# Patient Record
Sex: Male | Born: 1990 | Race: White | Hispanic: No | Marital: Married | Smoking: Never smoker
Health system: Southern US, Community
[De-identification: ages and names within clinical notes are randomized; demographics above are authoritative.]

---
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
## 2013-10-24 ENCOUNTER — Emergency Department: Payer: Self-pay | Admitting: Internal Medicine

## 2015-09-06 ENCOUNTER — Emergency Department
Admission: EM | Admit: 2015-09-06 | Discharge: 2015-09-06 | Disposition: A | Discharge status: Home / Self Care | Payer: 59 | Attending: Emergency Medicine | Admitting: Emergency Medicine

## 2015-09-06 ENCOUNTER — Emergency Department: Payer: 59

## 2015-09-06 ENCOUNTER — Encounter: Payer: Self-pay | Admitting: Emergency Medicine

## 2015-09-06 DIAGNOSIS — M79672 Pain in left foot: Secondary | ICD-10-CM | POA: Diagnosis not present

## 2015-09-06 DIAGNOSIS — M25572 Pain in left ankle and joints of left foot: Secondary | ICD-10-CM | POA: Diagnosis present

## 2015-09-06 NOTE — Discharge Instructions (Signed)
Musculoskeletal Pain Musculoskeletal pain is muscle and boney aches and pains. These pains can occur in any part of the body. Your caregiver may treat you without knowing the cause of the pain. They may treat you if blood or urine tests, X-rays, and other tests were normal.  CAUSES There is often not a definite cause or reason for these pains. These pains may be caused by a type of germ (virus). The discomfort may also come from overuse. Overuse includes working out too hard when your body is not fit. Boney aches also come from weather changes. Bone is sensitive to atmospheric pressure changes. HOME CARE INSTRUCTIONS   Ask when your test results will be ready. Make sure you get your test results.  Only take over-the-counter or prescription medicines for pain, discomfort, or fever as directed by your caregiver. If you were given medications for your condition, do not drive, operate machinery or power tools, or sign legal documents for 24 hours. Do not drink alcohol. Do not take sleeping pills or other medications that may interfere with treatment.  Continue all activities unless the activities cause more pain. When the pain lessens, slowly resume normal activities. Gradually increase the intensity and duration of the activities or exercise.  During periods of severe pain, bed rest may be helpful. Lay or sit in any position that is comfortable.  Putting ice on the injured area.  Put ice in a bag.  Place a towel between your skin and the bag.  Leave the ice on for 15 to 20 minutes, 3 to 4 times a day.  Follow up with your caregiver for continued problems and no reason can be found for the pain. If the pain becomes worse or does not go away, it may be necessary to repeat tests or do additional testing. Your caregiver may need to look further for a possible cause. SEEK IMMEDIATE MEDICAL CARE IF:  You have pain that is getting worse and is not relieved by medications.  You develop chest pain  that is associated with shortness or breath, sweating, feeling sick to your stomach (nauseous), or throw up (vomit).  Your pain becomes localized to the abdomen.  You develop any new symptoms that seem different or that concern you. MAKE SURE YOU:   Understand these instructions.  Will watch your condition.  Will get help right away if you are not doing well or get worse.   This information is not intended to replace advice given to you by your health care provider. Make sure you discuss any questions you have with your health care provider.   Document Released: 04/29/2005 Document Revised: 07/22/2011 Document Reviewed: 01/01/2013 Elsevier Interactive Patient Education 2016 [employer]   You can try Motrin for the over-the-counter pills 3 times a day with food for maybe a week. Please follow-up with Dr. Ether GriffinsFowler the podiatrist he may be over to help you more.

## 2015-09-06 NOTE — ED Notes (Signed)
Patient ambulatory to triage with steady gait, without difficulty or distress noted; pt reports left ankle pain x year; st dx tendonitis

## 2015-09-06 NOTE — ED Notes (Signed)
Pt presents to ED with c/o left ankle and foot pain x 1.5 years, pt denies known injury to extremity. Pt reports has been seen for same symptoms and dx with tendinitis. Pt reports pain has been increasing for the past two days. Reports pain increased with ambulation, swelling noted to top of left foot. (+) pedal pulses, capillary refill WNL, (+) movement and sensation. Pt alert and oriented x 4, no increased work in breathing noted.

## 2015-09-06 NOTE — ED Provider Notes (Signed)
[facility] Emergency Department Provider Note  ____________________________________________  Time seen: Approximately 7:27 AM  I have reviewed the triage vital signs and the nursing notes.   HISTORY  Chief Complaint Ankle Pain    HPI Tony Hubbard is a 25 y.o. male who reports for the last year and a half he's had some pain and swelling in the foot especially the top of the foot just distal to the ankle. This gets worse at the end of the day when he takes his shoe off. Usually he can walk around all day without trouble. It evaluated at urgent care one time sometime ago and they told him it was tendinitis but has never gotten any better. It is moderate in nature achy he has had no redness fever streaks up the leg or any other kind of associated symptoms   History reviewed. No pertinent past medical history.  There are no active problems to display for this patient.   History reviewed. No pertinent past surgical history.  No current outpatient prescriptions on file.  Allergies Review of patient's allergies indicates no known allergies.  No family history on file.  Social History Social History  Substance Use Topics  . Smoking status: Never Smoker   . Smokeless tobacco: None  . Alcohol Use: No    Review of Systems Constitutional: No fever/chills Eyes: No visual changes. ENT: No sore throat. Cardiovascular: Denies chest pain. Respiratory: Denies shortness of breath. Gastrointestinal: No abdominal pain.  No nausea, no vomiting.  No diarrhea.  No constipation. Genitourinary: Negative for dysuria. Musculoskeletal: Negative for back pain. Skin: Negative for rash.  10-point ROS otherwise negative.  ____________________________________________   PHYSICAL EXAM:  VITAL SIGNS: ED Triage Vitals  Enc Vitals Group     BP 09/06/15 0630 137/87 mmHg     Pulse Rate 09/06/15 0630 84     Resp 09/06/15 0630 18     Temp 09/06/15 0630 97.6 F (36.4 C)      Temp Source 09/06/15 0630 Oral     SpO2 09/06/15 0630 99 %     Weight 09/06/15 0630 300 lb (136.079 kg)     Height 09/06/15 0630  (1.956 m)     Head Cir --      Peak Flow --      Pain Score 09/06/15 0630 5     Pain Loc --      Pain Edu? --      Excl. in [place]? --    Constitutional: Alert and oriented. Well appearing and in no acute distress. Eyes: Conjunctivae are normal. PERRL. EOMI. Head: Atraumatic. Nose: No congestion/rhinnorhea. Mouth/Throat: Mucous membranes are moist.  Oropharynx non-erythematous. Neck: No stridor. Musculoskeletal: No lower extremity tenderness nor edema.  No joint effusions. Neurologic:  Normal speech and language. No gross focal neurologic deficits are appreciated. No gait instability. Skin:  Skin is warm, dry and intact. No rash noted. Psychiatric: Mood and affect are normal. Speech and behavior are normal.  ____________________________________________   LABS (all labs ordered are listed, but only abnormal results are displayed)  Labs Reviewed - No data to display ____________________________________________  EKG   ____________________________________________  RADIOLOGY  X-rays of foot and ankle reviewed by radiology showing no acute disease no explanation for patient's pain ____________________________________________   PROCEDURES   ____________________________________________   INITIAL IMPRESSION / ASSESSMENT AND PLAN / ED COURSE  Pertinent labs & imaging results that were available during my care of the patient were reviewed by me and considered  in my medical decision making (see chart for details).   ____________________________________________   FINAL CLINICAL IMPRESSION(S) / ED DIAGNOSES  Final diagnoses:  Foot pain, left      Arnaldo NatalPaul F Malinda, MD 09/06/15 [phone]

## 2015-09-28 ENCOUNTER — Other Ambulatory Visit: Payer: Self-pay | Admitting: Podiatry

## 2015-09-28 DIAGNOSIS — M67479 Ganglion, unspecified ankle and foot: Secondary | ICD-10-CM

## 2015-10-20 ENCOUNTER — Ambulatory Visit
Admission: RE | Admit: 2015-10-20 | Discharge: 2015-10-20 | Disposition: A | Discharge status: Home / Self Care | Payer: 59 | Source: Ambulatory Visit | Attending: Podiatry | Admitting: Podiatry

## 2015-10-20 DIAGNOSIS — M19072 Primary osteoarthritis, left ankle and foot: Secondary | ICD-10-CM | POA: Diagnosis not present

## 2015-10-20 DIAGNOSIS — M659 Synovitis and tenosynovitis, unspecified: Secondary | ICD-10-CM | POA: Insufficient documentation

## 2015-10-20 DIAGNOSIS — M67479 Ganglion, unspecified ankle and foot: Secondary | ICD-10-CM

## 2015-10-20 DIAGNOSIS — M25572 Pain in left ankle and joints of left foot: Secondary | ICD-10-CM | POA: Diagnosis present

## 2015-10-20 MED ORDER — GADOBENATE DIMEGLUMINE 529 MG/ML IV SOLN
20.0000 mL | Freq: Once | INTRAVENOUS | Status: AC | PRN
  Administered 2015-10-20: 20 mL via INTRAVENOUS

## 2016-09-30 ENCOUNTER — Emergency Department
Admission: EM | Admit: 2016-09-30 | Discharge: 2016-09-30 | Disposition: A | Discharge status: Home / Self Care | Payer: No Typology Code available for payment source | Attending: Emergency Medicine | Admitting: Emergency Medicine

## 2016-09-30 DIAGNOSIS — M545 Low back pain, unspecified: Secondary | ICD-10-CM

## 2016-09-30 DIAGNOSIS — Y939 Activity, unspecified: Secondary | ICD-10-CM | POA: Insufficient documentation

## 2016-09-30 DIAGNOSIS — Y999 Unspecified external cause status: Secondary | ICD-10-CM | POA: Insufficient documentation

## 2016-09-30 DIAGNOSIS — S39012A Strain of muscle, fascia and tendon of lower back, initial encounter: Secondary | ICD-10-CM | POA: Insufficient documentation

## 2016-09-30 DIAGNOSIS — Y929 Unspecified place or not applicable: Secondary | ICD-10-CM | POA: Insufficient documentation

## 2016-09-30 DIAGNOSIS — X58XXXA Exposure to other specified factors, initial encounter: Secondary | ICD-10-CM | POA: Insufficient documentation

## 2016-09-30 MED ORDER — CYCLOBENZAPRINE HCL 5 MG PO TABS: 5.0000 mg | ORAL_TABLET | Freq: Three times a day (TID) | ORAL | 0 refills | Status: DC | PRN

## 2016-09-30 MED ORDER — NABUMETONE 750 MG PO TABS: 750.0000 mg | ORAL_TABLET | Freq: Two times a day (BID) | ORAL | 0 refills | Status: DC

## 2016-09-30 NOTE — Discharge Instructions (Signed)
Your exam is essentially normal. Take the prescription meds as directed. Follow-up with [facility] for continued symptoms.

## 2016-09-30 NOTE — ED Notes (Signed)
See triage notes..the patient c/o lower back pain. Denies injury. Pt denies n/v/d, SOB, CP, urinary s/s or penile discharge. NAD.

## 2016-09-30 NOTE — ED Triage Notes (Signed)
Pt presents with low back pain without known injury that radiates to hips

## 2016-09-30 NOTE — ED Provider Notes (Signed)
[facility] Emergency Department Provider Note ____________________________________________  Time seen: 1916  I have reviewed the triage vital signs and the nursing notes.  HISTORY  Chief Complaint  Back Pain  HPI Tony Hubbard is a 26 y.o. male Presents to the ED for evaluation of low back pain without known injury. Patient reports a 4-5 day complaint of midline lower back pain with referral source the upper buttocks. He denies any bladder or bowel incontinence, foot drop, or distal paresthesias. 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 He's been seen in the past for similar flares but reports symptoms use resolved after seen in urgent care provider and being put on anti-inflammatories and muscle relaxants. He denies any treatment at home since the onset of his symptoms other than topical muscle rubs.  No past medical history on file.  There are no active problems to display for this patient.   No past surgical history on file.  Prior to Admission medications   Medication Sig Start Date End Date Taking? Authorizing Provider  cyclobenzaprine (FLEXERIL) 5 MG tablet Take 1 tablet (5 mg total) by mouth 3 (three) times daily as needed for muscle spasms. 09/30/16   Genevia Bouldin, Charlesetta Ivory, PA-C  nabumetone (RELAFEN) 750 MG tablet Take 1 tablet (750 mg total) by mouth 2 (two) times daily. 09/30/16   Jaime Grizzell, Charlesetta Ivory, PA-C   Allergies Patient has no known allergies.  No family history on file.  Social History Social History  Substance Use Topics  . Smoking status: Never Smoker  . Smokeless tobacco: Not on file  . Alcohol use No    Review of Systems  Constitutional: Negative for fever. Cardiovascular: Negative for chest pain. Respiratory: Negative for shortness of breath. Gastrointestinal: Negative for abdominal pain, vomiting and diarrhea. Genitourinary: Negative for  dysuria. Musculoskeletal: Positive for back pain. Skin: Negative for rash. Neurological: Negative for headaches, focal weakness or numbness. ____________________________________________  PHYSICAL EXAM:  VITAL SIGNS: ED Triage Vitals [09/30/16 1749]  Enc Vitals Group     BP 109/75     Pulse Rate 60     Resp 18     Temp      Temp src      SpO2 99 %     Weight (!) 305 lb (138.3 kg)     Height 6\' 5"  (1.956 m)     Head Circumference      Peak Flow      Pain Score 7     Pain Loc      Pain Edu?      Excl. in GC?     Constitutional: Alert and oriented. Well appearing and in no distress. Head: Normocephalic and atraumatic. Cardiovascular: Normal rate, regular rhythm. Normal distal pulses. Respiratory: Normal respiratory effort. No wheezes/rales/rhonchi. Gastrointestinal: Soft and nontender. No distention. Musculoskeletal:Normal spinal alignment without midline tenderness, spasm, deformity, or step-off. Normal supine to sit transition. Negative seated straight leg raise. Normal hip flexion as well as internal and intact rotation. Nontender with normal range of motion in all extremities.  Neurologic: Cranial nerves II through XII grossly intact. Normal LE DTRs bilaterally. Normal toe dorsiflexion and eversion noted. Normal gait without ataxia. Normal speech and language. No gross focal neurologic deficits are appreciated. Skin:  Skin is warm, dry and intact. No rash noted. ____________________________________________  INITIAL IMPRESSION / ASSESSMENT AND PLAN / ED COURSE  Patient with a current presentation of acute lumbar sacral  strain without sciatica. He is discharged at this time with prescription for Relafen and Flexeril dose as directed. He is further given instructions to follow with [place] for ongoing symptom management. Work note was provided for 1-2 days as needed. ____________________________________________  FINAL CLINICAL IMPRESSION(S) / ED DIAGNOSES  Final  diagnoses:  Strain of lumbar region, initial encounter  Acute bilateral low back pain without sciatica      Lissa HoardMenshew, Kailan Laws V Bacon, PA-C 09/30/16 1944    Minna AntisPaduchowski, Kevin, MD 09/30/16 2235

## 2018-08-14 DIAGNOSIS — Z7689 Persons encountering health services in other specified circumstances: Secondary | ICD-10-CM | POA: Diagnosis not present

## 2018-08-14 DIAGNOSIS — R110 Nausea: Secondary | ICD-10-CM | POA: Diagnosis not present

## 2018-08-14 DIAGNOSIS — R197 Diarrhea, unspecified: Secondary | ICD-10-CM | POA: Diagnosis not present

## 2018-09-29 DIAGNOSIS — H6982 Other specified disorders of Eustachian tube, left ear: Secondary | ICD-10-CM | POA: Diagnosis not present

## 2018-09-29 DIAGNOSIS — H9192 Unspecified hearing loss, left ear: Secondary | ICD-10-CM | POA: Diagnosis not present

## 2018-11-17 ENCOUNTER — Emergency Department

## 2018-11-17 ENCOUNTER — Emergency Department
Admission: EM | Admit: 2018-11-17 | Discharge: 2018-11-17 | Disposition: A | Discharge status: Home / Self Care | Attending: Emergency Medicine | Admitting: Emergency Medicine

## 2018-11-17 ENCOUNTER — Encounter: Payer: Self-pay | Admitting: Emergency Medicine

## 2018-11-17 ENCOUNTER — Other Ambulatory Visit: Payer: Self-pay

## 2018-11-17 DIAGNOSIS — Y9289 Other specified places as the place of occurrence of the external cause: Secondary | ICD-10-CM | POA: Insufficient documentation

## 2018-11-17 DIAGNOSIS — Y990 Civilian activity done for income or pay: Secondary | ICD-10-CM | POA: Insufficient documentation

## 2018-11-17 DIAGNOSIS — X509XXA Other and unspecified overexertion or strenuous movements or postures, initial encounter: Secondary | ICD-10-CM | POA: Diagnosis not present

## 2018-11-17 DIAGNOSIS — S63631A Sprain of interphalangeal joint of left index finger, initial encounter: Secondary | ICD-10-CM | POA: Diagnosis not present

## 2018-11-17 DIAGNOSIS — Y9389 Activity, other specified: Secondary | ICD-10-CM | POA: Insufficient documentation

## 2018-11-17 DIAGNOSIS — S6992XA Unspecified injury of left wrist, hand and finger(s), initial encounter: Secondary | ICD-10-CM | POA: Diagnosis present

## 2018-11-17 NOTE — ED Provider Notes (Signed)
[facility] Emergency Department Provider Note ____________________________________________  Time seen: 2111  I have reviewed the triage vital signs and the nursing notes.  HISTORY  Chief Complaint  Finger Injury  HPI Tony Hubbard is a 28 y.o. male presents himself to the ED for evaluation of left index finger pain.  Patient is right-hand dominant, describes an injury that occurred about a week prior at work.  He describes his left index finger got caught in packing tape that he was cutting away from large packing boxes.  The boxes failed as the tape was pulled away, and caused a twisting injury as the tape wrapped around his index finger.  Since that time he said pain and disability across the dorsal finger primarily at the PIP.  He describes pain with attempts to fully extend the finger, but does not describe any decreased range of motion.  He denies any other injury at this time.  History reviewed. No pertinent past medical history.  There are no active problems to display for this patient.  History reviewed. No pertinent surgical history.  Prior to Admission medications   Not on File    Allergies Patient has no known allergies.  No family history on file.  Social History Social History   Tobacco Use  . Smoking status: Never Smoker  Substance Use Topics  . Alcohol use: No  . Drug use: Not on file    Review of Systems  Constitutional: Negative for fever. Cardiovascular: Negative for chest pain. Respiratory: Negative for shortness of breath. Musculoskeletal: Negative for back pain.  Left index finger pain as above. Skin: Negative for rash. Neurological: Negative for headaches, focal weakness or numbness. ____________________________________________  PHYSICAL EXAM:  VITAL SIGNS: ED Triage Vitals  Enc Vitals Group     BP 11/17/18 1914 125/79     Pulse Rate 11/17/18 1914 84     Resp --      Temp 11/17/18 1914 98.2 F (36.8 C)     Temp  Source 11/17/18 1914 Oral     SpO2 11/17/18 1914 97 %     Weight 11/17/18 1915 (!) 318 lb (144.2 kg)     Height 11/17/18 1915 6\' 5"  (1.956 m)     Head Circumference --      Peak Flow --      Pain Score 11/17/18 1914 8     Pain Loc --      Pain Edu? --      Excl. in [place]? --     Constitutional: Alert and oriented. Well appearing and in no distress. Head: Normocephalic and atraumatic. Eyes: Conjunctivae are normal. Normal extraocular movements Cardiovascular: Normal rate, regular rhythm. Normal distal pulses. Respiratory: Normal respiratory effort. Musculoskeletal: Left hand without any obvious deformity or dislocation.  There is some subtle swelling over the left index finger at the PIP.  Patient with full extension range and slightly decreased flexion range secondary to pain at the PIP.  Normal composite fist noted. Nontender with normal range of motion in all extremities.  Neurologic:  Normal gross sensation. Normal speech and language. No gross focal neurologic deficits are appreciated. Skin:  Skin is warm, dry and intact. No rash noted. ____________________________________________   RADIOLOGY  Left Index Finger  Negative  I, Jung Yurchak V Bacon-Nathaneil Feagans, personally viewed and evaluated these images (plain radiographs) as part of my medical decision making, as well as reviewing the written report by the radiologist. ____________________________________________  PROCEDURES  Procedures Buddy tape Finger splint ____________________________________________  INITIAL  IMPRESSION / ASSESSMENT AND PLAN / ED COURSE  Ronne BinningJohn L Abraha was evaluated in Emergency Department on 11/17/2018 for the symptoms described in the history of present illness. He was evaluated in the context of the global COVID-19 pandemic, which necessitated consideration that the patient might be at risk for infection with the SARS-CoV-2 virus that causes COVID-19. Institutional protocols and algorithms that pertain to the  evaluation of patients at risk for COVID-19 are in a state of rapid change based on information released by regulatory bodies including the CDC and federal and state organizations. These policies and algorithms were followed during the patient's care in the ED.  Patient with ED evaluation of left index pain and disability. He is found to have a IP strain after his negative XR. Fingers are buddy-taped and a static splint is provided. He will follow-up with Ortho or the company's preferred provider.  ____________________________________________  FINAL CLINICAL IMPRESSION(S) / ED DIAGNOSES  Final diagnoses:  Sprain of interphalangeal joint of left index finger, initial encounter      Lissa HoardMenshew, Gaye Scorza V Bacon, PA-C 11/17/18 2203    Arnaldo NatalMalinda, Paul F, MD 11/17/18 2255

## 2018-11-17 NOTE — Discharge Instructions (Addendum)
Your exam and XR are consistent with a finger sprain. There is no evidence of a fracture or dislocation. Wear the finger splint or buddy tape the finger for comfort. Follow-up with Ortho if symptoms don't improve in 1-2 weeks.

## 2018-11-17 NOTE — ED Notes (Signed)
Pt to the er for injury sustained last week. Pt was at work pulling plastic off a box when the box fell on his hand. Pt states that the pain wasn't that bad but the next day it had become swollen and had decreased ROM. Pt is able to bend the index finger on the left hand to a 45 degree angle but unable to close his hand all the way. Mild bruising noted to the posterior finger at the middle knuckle. Mild swelling present.

## 2018-11-17 NOTE — ED Triage Notes (Signed)
Pt arrives with concerns over pain to his left index finger from mechanical injury last Thursday.

## 2018-12-16 ENCOUNTER — Other Ambulatory Visit: Payer: Self-pay

## 2018-12-16 ENCOUNTER — Ambulatory Visit
Admission: EM | Admit: 2018-12-16 | Discharge: 2018-12-16 | Disposition: A | Discharge status: Home / Self Care | Payer: 59 | Attending: Family Medicine | Admitting: Family Medicine

## 2018-12-16 DIAGNOSIS — M5442 Lumbago with sciatica, left side: Secondary | ICD-10-CM | POA: Diagnosis not present

## 2018-12-16 MED ORDER — METAXALONE 800 MG PO TABS: 800.0000 mg | ORAL_TABLET | Freq: Three times a day (TID) | ORAL | 0 refills | Status: DC | PRN

## 2018-12-16 MED ORDER — MELOXICAM 15 MG PO TABS: 15.0000 mg | ORAL_TABLET | Freq: Every day | ORAL | 0 refills | Status: DC | PRN

## 2018-12-16 NOTE — ED Triage Notes (Signed)
Pt here for sciatica that started last night. He states he has had this before a while ago but having pain mostly on his left low back and shoots to the right side. Pain is constant but worse with movement and no urinary symptoms or injuries reported.

## 2018-12-16 NOTE — ED Provider Notes (Signed)
[facility]    CSN: [id number] Arrival date & time: 12/16/18  1637  History   Chief Complaint Chief Complaint  Patient presents with  . Sciatica   HPI  28 year old male presents with back pain.  Patient reports left low back pain which started last night. Severe. Worse with movement. Has taking Advil or Aleve (unsure which) without resolution. Reports a history of sciatica. Reports numbness/tingling down the left leg. No urinary symptoms. No other associated symptoms. No other complaints.  PMH, Surgical Hx, Family Hx, Social History reviewed and updated as below.  PMH: Hx of fracture; Hx of back pain; obesity  Surgical Hx: Wisdom teeth extraction  Home Medications    Prior to Admission medications   Medication Sig Start Date End Date Taking? Authorizing Provider  meloxicam (MOBIC) 15 MG tablet Take 1 tablet (15 mg total) by mouth daily as needed. 12/16/18   Coral Spikes, DO  metaxalone (SKELAXIN) 800 MG tablet Take 1 tablet (800 mg total) by mouth 3 (three) times daily as needed for muscle spasms. 12/16/18   Coral Spikes, DO    Family History Family History  Problem Relation Age of Onset  . Healthy Mother   . Healthy Father     Social History Social History   Tobacco Use  . Smoking status: Never Smoker  . Smokeless tobacco: Never Used  Substance Use Topics  . Alcohol use: No  . Drug use: Not on file     Allergies   Patient has no known allergies.   Review of Systems Review of Systems  Constitutional: Negative.   Genitourinary: Negative.   Musculoskeletal: Positive for back pain.   Physical Exam Triage Vital Signs ED Triage Vitals  Enc Vitals Group     BP 12/16/18 1733 121/76     Pulse Rate 12/16/18 1733 71     Resp 12/16/18 1733 18     Temp 12/16/18 1733 98.1 F (36.7 C)     Temp Source 12/16/18 1733 Oral     SpO2 12/16/18 1733 99 %     Weight 12/16/18 1734 (!) 317 lb (143.8 kg)     Height 12/16/18 1734 6\' 5"  (1.956 m)     Head  Circumference --      Peak Flow --      Pain Score 12/16/18 1734 8     Pain Loc --      Pain Edu? --      Excl. in [place]? --    Updated Vital Signs BP 121/76 (BP Location: Right Arm)   Pulse 71   Temp 98.1 F (36.7 C) (Oral)   Resp 18   Ht 6\' 5"  (1.956 m)   Wt (!) 143.8 kg   SpO2 99%   BMI 37.59 kg/m   Visual Acuity Right Eye Distance:   Left Eye Distance:   Bilateral Distance:    Right Eye Near:   Left Eye Near:    Bilateral Near:     Physical Exam Vitals signs and nursing note reviewed.  Constitutional:      General: He is not in acute distress.    Appearance: Normal appearance. He is obese.  HENT:     Head: Normocephalic and atraumatic.  Eyes:     General:        Right eye: No discharge.        Left eye: No discharge.     Conjunctiva/sclera: Conjunctivae normal.  Cardiovascular:     Rate and Rhythm:  Normal rate and regular rhythm.  Pulmonary:     Effort: Pulmonary effort is normal.     Breath sounds: Normal breath sounds.  Musculoskeletal:     Comments: Lumbar spine - left sided paraspinal musculature tenderness to palpation. Decreased ROM secondary to pain.  Neurological:     Mental Status: He is alert.  Psychiatric:        Mood and Affect: Mood normal.        Behavior: Behavior normal.    UC Treatments / Results  Labs (all labs ordered are listed, but only abnormal results are displayed) Labs Reviewed - No data to display  EKG   Radiology No results found.  Procedures Procedures (including critical care time)  Medications Ordered in UC Medications - No data to display  Initial Impression / Assessment and Plan / UC Course  I have reviewed the triage vital signs and the nursing notes.  Pertinent labs & imaging results that were available during my care of the patient were reviewed by me and considered in my medical decision making (see chart for details).    28 year old male present with low back pain with sciatica. Treating with mobic  and skelaxin.  Final Clinical Impressions(s) / UC Diagnoses   Final diagnoses:  Acute left-sided low back pain with left-sided sciatica   Discharge Instructions   None    ED Prescriptions    Medication Sig Dispense Auth. Provider   meloxicam (MOBIC) 15 MG tablet Take 1 tablet (15 mg total) by mouth daily as needed. 30 tablet Chandrika Sandles G, DO   metaxalone (SKELAXIN) 800 MG tablet Take 1 tablet (800 mg total) by mouth 3 (three) times daily as needed for muscle spasms. 30 tablet Tommie Samsook, Sten Dematteo G, DO     Controlled Substance Prescriptions [place] Controlled Substance Registry consulted? Not Applicable   Tommie SamsCook, Michael Walrath G, DO 12/16/18 2131

## 2019-05-15 ENCOUNTER — Encounter: Payer: Self-pay | Admitting: Emergency Medicine

## 2019-05-15 ENCOUNTER — Other Ambulatory Visit: Payer: Self-pay

## 2019-05-15 ENCOUNTER — Emergency Department
Admission: EM | Admit: 2019-05-15 | Discharge: 2019-05-15 | Disposition: A | Discharge status: Home / Self Care | Payer: 59 | Attending: Emergency Medicine | Admitting: Emergency Medicine

## 2019-05-15 DIAGNOSIS — R509 Fever, unspecified: Secondary | ICD-10-CM | POA: Diagnosis present

## 2019-05-15 DIAGNOSIS — U071 COVID-19: Secondary | ICD-10-CM | POA: Diagnosis not present

## 2019-05-15 MED ORDER — ACETAMINOPHEN 325 MG PO TABS
650.0000 mg | ORAL_TABLET | Freq: Once | ORAL | Status: AC | PRN
  Administered 2019-05-15: 650 mg via ORAL
  Filled 2019-05-15: qty 2

## 2019-05-15 NOTE — ED Triage Notes (Signed)
Pt arrived via POV with reports of cough, fever, body aches, HA that started last night.  Pt reports tmax of 100.  Pt reports taking Mucinex this morning.  Denies any COVID contacts.

## 2019-05-15 NOTE — ED Provider Notes (Signed)
[facility] Emergency Department Provider Note  ____________________________________________  Time seen: Approximately 9:28 PM  I have reviewed the triage vital signs and the nursing notes.   HISTORY  Chief Complaint Fever, Cough, and Generalized Body Aches    HPI Tony Hubbard is a 29 y.o. male that presents to the emergency department for evaluation of fever, headache, body aches, cough since last night.  Highest fever was 100.  Patient took Mucinex this morning.  Patient does not smoke.  He is otherwise healthy.  He is here today with his wife with similar symptoms.  No shortness of breath, chest pain, nausea, vomiting, abdominal pain, diarrhea.   History reviewed. No pertinent past medical history.  There are no problems to display for this patient.   History reviewed. No pertinent surgical history.  Prior to Admission medications   Medication Sig Start Date End Date Taking? Authorizing Provider  meloxicam (MOBIC) 15 MG tablet Take 1 tablet (15 mg total) by mouth daily as needed. 12/16/18   Coral Spikes, DO  metaxalone (SKELAXIN) 800 MG tablet Take 1 tablet (800 mg total) by mouth 3 (three) times daily as needed for muscle spasms. 12/16/18   Coral Spikes, DO    Allergies Patient has no known allergies.  Family History  Problem Relation Age of Onset  . Healthy Mother   . Healthy Father     Social History Social History   Tobacco Use  . Smoking status: Never Smoker  . Smokeless tobacco: Never Used  Substance Use Topics  . Alcohol use: No  . Drug use: Not on file     Review of Systems  Constitutional: Positive for fever/chills Eyes: No visual changes. No discharge. ENT: Negative for congestion and rhinorrhea. Cardiovascular: No chest pain. Respiratory: Positive for cough. No SOB. Gastrointestinal: No abdominal pain.  No nausea, no vomiting.  No diarrhea.  No constipation. Musculoskeletal: Negative for musculoskeletal pain. Skin:  Negative for rash, abrasions, lacerations, ecchymosis. Neurological: Positive for headaches.   ____________________________________________   PHYSICAL EXAM:  VITAL SIGNS: ED Triage Vitals  Enc Vitals Group     BP 05/15/19 1911 120/73     Pulse Rate 05/15/19 1911 (!) 113     Resp 05/15/19 1911 18     Temp 05/15/19 1911 (!) 100.9 F (38.3 C)     Temp Source 05/15/19 1911 Oral     SpO2 05/15/19 1911 98 %     Weight 05/15/19 1908 (!) 319 lb (144.7 kg)     Height 05/15/19 1908 6\' 5"  (1.956 m)     Head Circumference --      Peak Flow --      Pain Score 05/15/19 1908 5     Pain Loc --      Pain Edu? --      Excl. in [place]? --      Constitutional: Alert and oriented. Well appearing and in no acute distress. Eyes: Conjunctivae are normal. PERRL. EOMI. No discharge. Head: Atraumatic. ENT: No frontal and maxillary sinus tenderness.      Ears: Tympanic membranes pearly gray with good landmarks. No discharge.      Nose: No congestion/rhinnorhea.      Mouth/Throat: Mucous membranes are moist. Oropharynx non-erythematous. Tonsils not enlarged. No exudates. Uvula midline. Neck: No stridor.   Hematological/Lymphatic/Immunilogical: No cervical lymphadenopathy. Cardiovascular: Normal rate, regular rhythm.  Good peripheral circulation. Respiratory: Normal respiratory effort without tachypnea or retractions. Lungs CTAB. Good air entry to the bases with no decreased  or absent breath sounds. Gastrointestinal: Bowel sounds 4 quadrants. Soft and nontender to palpation. No guarding or rigidity. No palpable masses. No distention. Musculoskeletal: Full range of motion to all extremities. No gross deformities appreciated. Neurologic:  Normal speech and language. No gross focal neurologic deficits are appreciated.  Skin:  Skin is warm, dry and intact. No rash noted. Psychiatric: Mood and affect are normal. Speech and behavior are normal. Patient exhibits appropriate insight and  judgement.   ____________________________________________   LABS (all labs ordered are listed, but only abnormal results are displayed)  Labs Reviewed  SARS CORONAVIRUS 2 (TAT 6-24 HRS)   ____________________________________________  EKG   ____________________________________________  RADIOLOGY   No results found.  ____________________________________________    PROCEDURES  Procedure(s) performed:    Procedures    Medications  acetaminophen (TYLENOL) tablet 650 mg (650 mg Oral Given 05/15/19 1915)     ____________________________________________   INITIAL IMPRESSION / ASSESSMENT AND PLAN / ED COURSE  Pertinent labs & imaging results that were available during my care of the patient were reviewed by me and considered in my medical decision making (see chart for details).  Review of the [place] CSRS was performed in accordance of the [facility] prior to dispensing any controlled drugs.   Patient's diagnosis is consistent with Covid 19. Vital signs and exam are reassuring.  Patient is here with his wife who has tested positive for COVID-19 in the emergency department.  Patient's Covid test is pending.  Patient appears well and is staying well hydrated. Patient feels comfortable going home. Patient is to follow up with primary care and health department as needed or otherwise directed. Patient is given ED precautions to return to the ED for any worsening or new symptoms.  Tony Hubbard was evaluated in Emergency Department on 05/15/2019 for the symptoms described in the history of present illness. He was evaluated in the context of the global COVID-19 pandemic, which necessitated consideration that the patient might be at risk for infection with the SARS-CoV-2 virus that causes COVID-19. Institutional protocols and algorithms that pertain to the evaluation of patients at risk for COVID-19 are in a state of rapid change based on information released by regulatory bodies including  the CDC and federal and state organizations. These policies and algorithms were followed during the patient's care in the ED.   ____________________________________________  FINAL CLINICAL IMPRESSION(S) / ED DIAGNOSES  Final diagnoses:  COVID-19      NEW MEDICATIONS STARTED DURING THIS VISIT:  ED Discharge Orders    None          This chart was dictated using voice recognition software/Dragon. Despite best efforts to proofread, errors can occur which can change the meaning. Any change was purely unintentional.    Enid Derry, PA-C 05/15/19 2339    Shaune Pollack, MD 05/17/19 [phone]

## 2019-05-15 NOTE — ED Notes (Signed)
Patient reports fever, chills and body aches since last night.

## 2019-05-16 LAB — SARS CORONAVIRUS 2 (TAT 6-24 HRS): SARS Coronavirus 2: POSITIVE — AB

## 2019-05-17 ENCOUNTER — Telehealth: Payer: Self-pay | Admitting: Emergency Medicine

## 2019-05-17 NOTE — Telephone Encounter (Signed)
Called patient and informed of positive covid .  He is aware.

## 2020-10-02 ENCOUNTER — Ambulatory Visit
Admission: EM | Admit: 2020-10-02 | Discharge: 2020-10-02 | Disposition: A | Discharge status: Home / Self Care | Payer: 59 | Attending: Physician Assistant | Admitting: Physician Assistant

## 2020-10-02 ENCOUNTER — Other Ambulatory Visit: Payer: Self-pay

## 2020-10-02 DIAGNOSIS — Z79899 Other long term (current) drug therapy: Secondary | ICD-10-CM | POA: Insufficient documentation

## 2020-10-02 DIAGNOSIS — Z20822 Contact with and (suspected) exposure to covid-19: Secondary | ICD-10-CM | POA: Insufficient documentation

## 2020-10-02 DIAGNOSIS — M542 Cervicalgia: Secondary | ICD-10-CM | POA: Insufficient documentation

## 2020-10-02 DIAGNOSIS — R42 Dizziness and giddiness: Secondary | ICD-10-CM | POA: Diagnosis not present

## 2020-10-02 DIAGNOSIS — J101 Influenza due to other identified influenza virus with other respiratory manifestations: Secondary | ICD-10-CM | POA: Diagnosis present

## 2020-10-02 DIAGNOSIS — J09X2 Influenza due to identified novel influenza A virus with other respiratory manifestations: Secondary | ICD-10-CM | POA: Diagnosis not present

## 2020-10-02 LAB — RESP PANEL BY RT-PCR (FLU A&B, COVID) ARPGX2
Influenza A by PCR: POSITIVE — AB
Influenza B by PCR: NEGATIVE
SARS Coronavirus 2 by RT PCR: NEGATIVE

## 2020-10-02 MED ORDER — IBUPROFEN 800 MG PO TABS
800.0000 mg | ORAL_TABLET | Freq: Once | ORAL | Status: AC
  Administered 2020-10-02: 800 mg via ORAL

## 2020-10-02 MED ORDER — BENZONATATE 200 MG PO CAPS: 200.0000 mg | ORAL_CAPSULE | Freq: Two times a day (BID) | ORAL | 0 refills | Status: AC | PRN

## 2020-10-02 MED ORDER — OSELTAMIVIR PHOSPHATE 75 MG PO CAPS: 75.0000 mg | ORAL_CAPSULE | Freq: Two times a day (BID) | ORAL | 0 refills | Status: AC

## 2020-10-02 MED ORDER — HYDROCOD POLST-CPM POLST ER 10-8 MG/5ML PO SUER: 5.0000 mL | Freq: Two times a day (BID) | ORAL | 0 refills | Status: DC

## 2020-10-02 NOTE — Discharge Instructions (Signed)
Hydrate well You may continue Motrin or Tylenol for fever and aches.

## 2020-10-02 NOTE — ED Triage Notes (Signed)
Pt c/o headache, dizziness, pain in shoulder blades and neck, bilateral arm numbness/tingling and cough. Pt states his back pain started first, then the cough, arm issues and headache. Pt denies fever, diarrhea - has had some post-tussive emesis.

## 2020-10-02 NOTE — ED Provider Notes (Signed)
[facility]    CSN: [id number] Arrival date & time: 10/02/20  1630      History   Chief Complaint Chief Complaint  Patient presents with  . Cough  . Headache  . Neck Pain    HPI STOCKTON NUNLEY is a 30 y.o. male who presents with R rhomboid pain that started when he stepped wrong and caused him to jerk his back yesterday. Latter that night he was not feeling well and developed a cough. Last night felt as he had a fever but his thermoter registered as normal. Last night in certain arm positions his arms would neck numb and tingle. Has been having non productive cough with cough attacks. This am has HA and body aches and feels worse. Cough attacks makes him dizzy.    History reviewed. No pertinent past medical history.  There are no problems to display for this patient.   History reviewed. No pertinent surgical history.     Home Medications    Prior to Admission medications   Medication Sig Start Date End Date Taking? Authorizing Provider  oseltamivir (TAMIFLU) 75 MG capsule Take 1 capsule (75 mg total) by mouth every 12 (twelve) hours. 10/02/20  Yes Rodriguez-Southworth, Nettie Elm, PA-C  chlorpheniramine-HYDROcodone (TUSSIONEX PENNKINETIC ER) 10-8 MG/5ML SUER Take 5 mLs by mouth 2 (two) times daily. 10/02/20  Yes Rodriguez-Southworth, Nettie Elm, PA-C    Family History Family History  Problem Relation Age of Onset  . Healthy Mother   . Healthy Father     Social History Social History   Tobacco Use  . Smoking status: Never Smoker  . Smokeless tobacco: Never Used  Vaping Use  . Vaping Use: Never used  Substance Use Topics  . Alcohol use: No     Allergies   Patient has no known allergies.   Review of Systems Review of Systems  Constitutional: Positive for chills and fever. Negative for diaphoresis.  HENT: Positive for congestion, postnasal drip and rhinorrhea. Negative for ear discharge, ear pain and sore throat.   Eyes: Negative for discharge.   Respiratory: Positive for cough. Negative for chest tightness, shortness of breath and wheezing.   Cardiovascular: Negative for chest pain.  Gastrointestinal: Negative for diarrhea, nausea and vomiting.  Musculoskeletal: Positive for back pain, myalgias and neck pain. Negative for gait problem and neck stiffness.  Neurological: Positive for dizziness, numbness and headaches. Negative for weakness.     Physical Exam Triage Vital Signs ED Triage Vitals  Enc Vitals Group     BP --      Pulse Rate 10/02/20 1643 (!) 109     Resp 10/02/20 1643 18     Temp 10/02/20 1643 (!) 101.1 F (38.4 C)     Temp Source 10/02/20 1643 Oral     SpO2 10/02/20 1643 99 %     Weight 10/02/20 1641 (!) 327 lb (148.3 kg)     Height 10/02/20 1641 6\' 5"  (1.956 m)     Head Circumference --      Peak Flow --      Pain Score 10/02/20 1641 7     Pain Loc --      Pain Edu? --      Excl. in [place]? --    No data found.  Updated Vital Signs BP 134/80 (BP Location: Left Arm)   Pulse (!) 109   Temp (!) 101.1 F (38.4 C) (Oral)   Resp 18   Ht 6\' 5"  (1.956 m)   Wt (!) 327  lb (148.3 kg)   SpO2 99%   BMI 38.78 kg/m   Visual Acuity Right Eye Distance:   Left Eye Distance:   Bilateral Distance:    Right Eye Near:   Left Eye Near:    Bilateral Near:     Physical Exam Vitals and nursing note reviewed.  Constitutional:      General: He is not in acute distress.    Appearance: He is obese. He is ill-appearing. He is not toxic-appearing.  HENT:     Head: Normocephalic.     Right Ear: Tympanic membrane, ear canal and external ear normal.     Left Ear: Tympanic membrane, ear canal and external ear normal.     Nose: Congestion and rhinorrhea present.     Mouth/Throat:     Mouth: Mucous membranes are moist.     Pharynx: Oropharynx is clear.  Eyes:     General: No scleral icterus.    Extraocular Movements: Extraocular movements intact.     Conjunctiva/sclera: Conjunctivae normal.  Neck:     Meningeal:  Brudzinski's sign absent.  Cardiovascular:     Rate and Rhythm: Regular rhythm. Tachycardia present.     Heart sounds: No murmur heard.   Pulmonary:     Effort: Pulmonary effort is normal.     Breath sounds: Normal breath sounds. No wheezing or rales.  Musculoskeletal:        General: Normal range of motion.       Arms:     Cervical back: Normal range of motion and neck supple. No rigidity. Muscular tenderness present.     Comments: Has tender R trapezius and rhomboid  which feels tense  Lymphadenopathy:     Cervical: No cervical adenopathy.  Skin:    General: Skin is warm and dry.     Findings: No rash.  Neurological:     Mental Status: He is alert and oriented to person, place, and time.     Sensory: No sensory deficit.     Motor: No weakness.     Gait: Gait normal.     Deep Tendon Reflexes: Reflexes normal.  Psychiatric:        Mood and Affect: Mood normal.        Behavior: Behavior normal.        Thought Content: Thought content normal.        Judgment: Judgment normal.      UC Treatments / Results  Labs (all labs ordered are listed, but only abnormal results are displayed) Labs Reviewed  RESP PANEL BY RT-PCR (FLU A&B, COVID) ARPGX2 - Abnormal; Notable for the following components:      Result Value   Influenza A by PCR POSITIVE (*)    All other components within normal limits   Fly B and Covid is neg.  EKG   Radiology No results found.  Procedures Procedures (including critical care time)  Medications Ordered in UC Medications  ibuprofen (ADVIL) tablet 800 mg (800 mg Oral Given 10/02/20 1742)    Initial Impression / Assessment and Plan / UC Course  I have reviewed the triage vital signs and the nursing notes. Pertinent labs results that were available during my care of the patient were reviewed by me and considered in my medical decision making (see chart for details). Has influenza B and Rhomboid/Tapesiuz strain. I placed him on Tamiflu as noted.  May use ice and heat on back tender area and do stretches I taught him I prescribed him Tussionex as  well.  Final Clinical Impressions(s) / UC Diagnoses   Final diagnoses:  Influenza due to identified novel influenza A virus with other respiratory manifestations     Discharge Instructions     Hydrate well You may continue Motrin or Tylenol for fever and aches.     ED Prescriptions    Medication Sig Dispense Auth. Provider   oseltamivir (TAMIFLU) 75 MG capsule Take 1 capsule (75 mg total) by mouth every 12 (twelve) hours. 10 capsule Rodriguez-Southworth, Nettie Elm, PA-C   chlorpheniramine-HYDROcodone (TUSSIONEX PENNKINETIC ER) 10-8 MG/5ML SUER Take 5 mLs by mouth 2 (two) times daily. 140 mL Rodriguez-Southworth, Nettie Elm, PA-C     PDMP not reviewed this encounter.   Garey Ham, [place] 10/02/20 1803

## 2020-10-03 IMAGING — CR LEFT INDEX FINGER 2+V
1 series · 3 of 3 positions shown · non-contrast
Comparison: None.

CLINICAL DATA: Left index finger pain after injury [REDACTED].

EXAM:
LEFT INDEX FINGER 2+V

[Series 1: x finger pa left · 0.14mm/px · 3 of 3 slices shown]
[im 1/3]
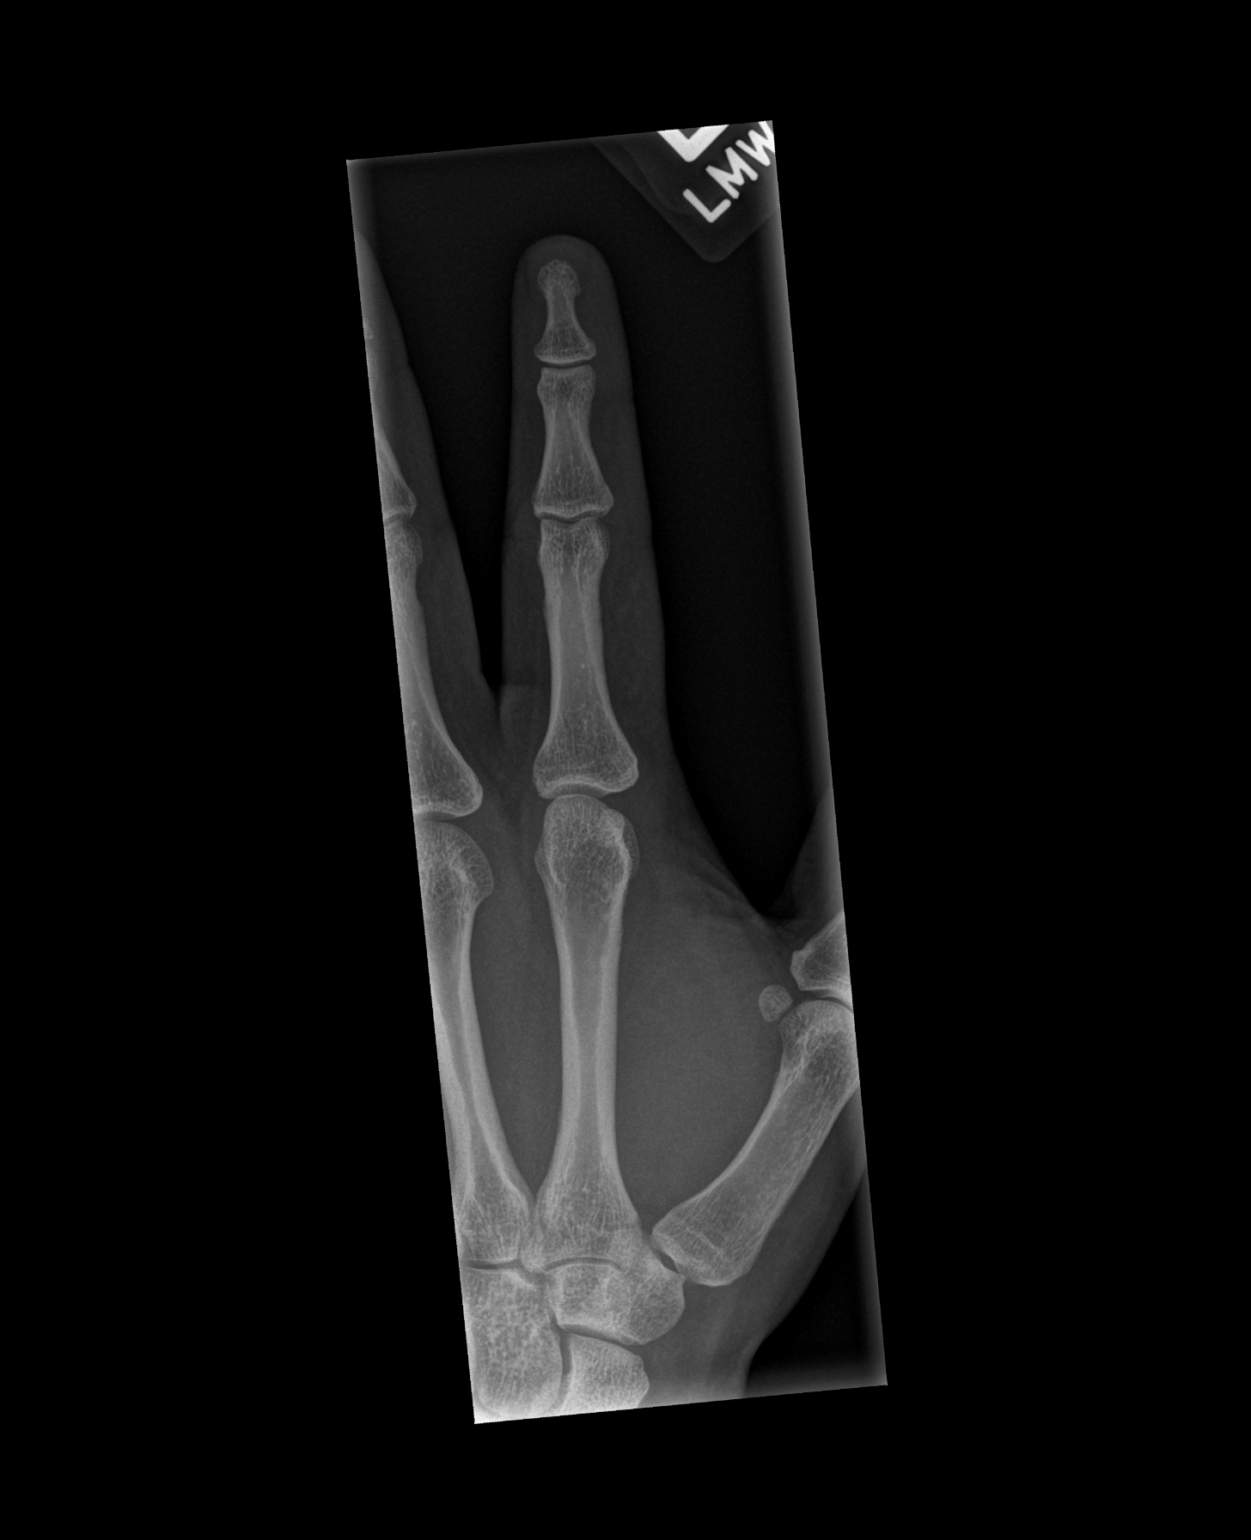
[im 2/3]
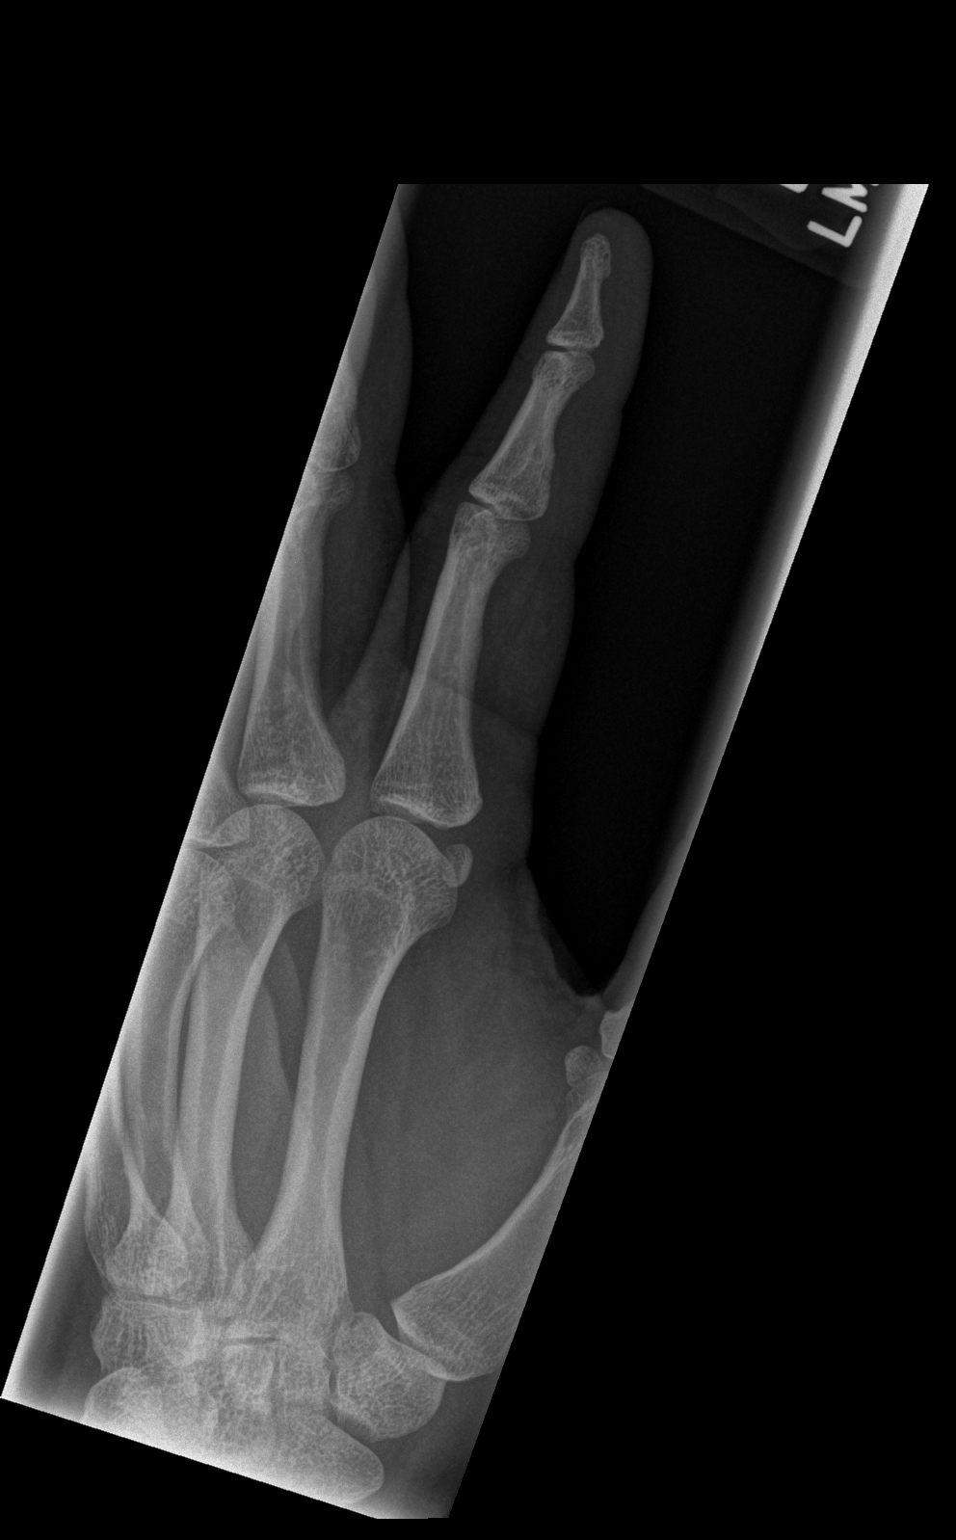
[im 3/3]
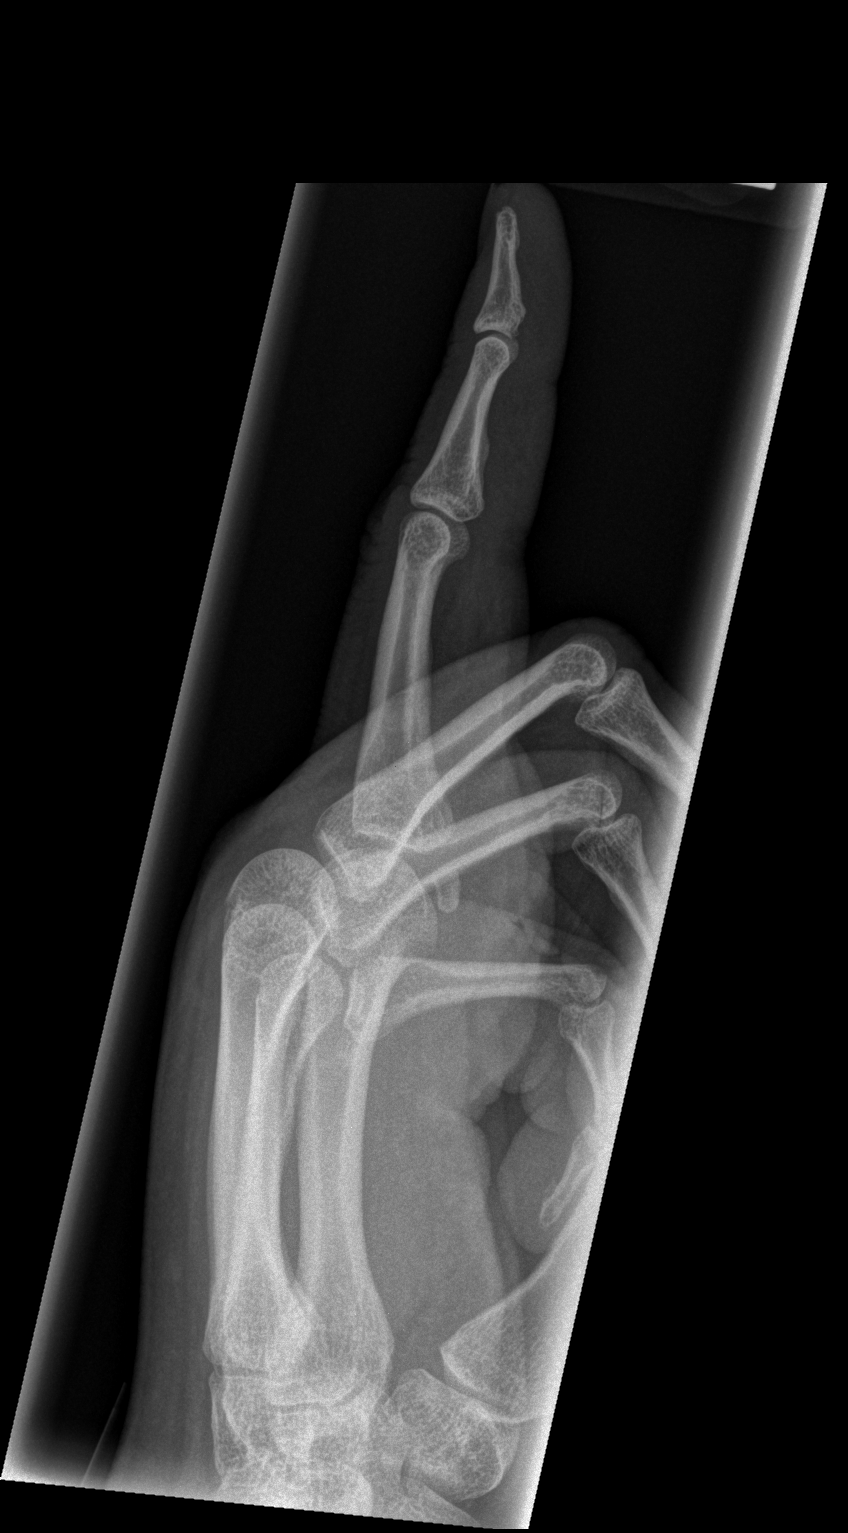

[3 of 3 positions shown; findings below may reference images not displayed]

FINDINGS: There is no evidence of fracture or dislocation. There is no
evidence of arthropathy or other focal bone abnormality. Soft
tissues are unremarkable.
IMPRESSION: Negative radiographs of the left index finger.

## 2021-12-03 ENCOUNTER — Emergency Department: Payer: BC Managed Care – PPO

## 2021-12-03 ENCOUNTER — Encounter: Payer: Self-pay | Admitting: Intensive Care

## 2021-12-03 ENCOUNTER — Other Ambulatory Visit: Payer: Self-pay

## 2021-12-03 ENCOUNTER — Observation Stay
Admission: EM | Admit: 2021-12-03 | Discharge: 2021-12-05 | Disposition: A | Discharge status: Home / Self Care | Payer: BC Managed Care – PPO | Attending: General Surgery | Admitting: General Surgery

## 2021-12-03 DIAGNOSIS — R1031 Right lower quadrant pain: Secondary | ICD-10-CM | POA: Diagnosis present

## 2021-12-03 DIAGNOSIS — D72829 Elevated white blood cell count, unspecified: Secondary | ICD-10-CM | POA: Diagnosis not present

## 2021-12-03 DIAGNOSIS — K353 Acute appendicitis with localized peritonitis, without perforation or gangrene: Principal | ICD-10-CM | POA: Diagnosis present

## 2021-12-03 LAB — URINALYSIS, ROUTINE W REFLEX MICROSCOPIC
Bilirubin Urine: NEGATIVE
Glucose, UA: NEGATIVE mg/dL
Hgb urine dipstick: NEGATIVE
Ketones, ur: NEGATIVE mg/dL
Leukocytes,Ua: NEGATIVE
Nitrite: NEGATIVE
Protein, ur: NEGATIVE mg/dL
Specific Gravity, Urine: 1.028 (ref 1.005–1.030)
pH: 5 (ref 5.0–8.0)

## 2021-12-03 LAB — COMPREHENSIVE METABOLIC PANEL
ALT: 31 U/L (ref 0–44)
AST: 25 U/L (ref 15–41)
Albumin: 4 g/dL (ref 3.5–5.0)
Alkaline Phosphatase: 47 U/L (ref 38–126)
Anion gap: 6 (ref 5–15)
BUN: 15 mg/dL (ref 6–20)
CO2: 23 mmol/L (ref 22–32)
Calcium: 9.3 mg/dL (ref 8.9–10.3)
Chloride: 109 mmol/L (ref 98–111)
Creatinine, Ser: 1.13 mg/dL (ref 0.61–1.24)
GFR, Estimated: >60 mL/min (ref >60)
Glucose, Bld: 98 mg/dL (ref 70–99)
Potassium: 3.8 mmol/L (ref 3.5–5.1)
Sodium: 138 mmol/L (ref 135–145)
Total Bilirubin: 0.9 mg/dL (ref 0.3–1.2)
Total Protein: 7.5 g/dL (ref 6.5–8.1)

## 2021-12-03 LAB — CBC
HCT: 47.5 % (ref 39.0–52.0)
Hemoglobin: 16.4 g/dL (ref 13.0–17.0)
MCH: 32.2 pg (ref 26.0–34.0)
MCHC: 34.5 g/dL (ref 30.0–36.0)
MCV: 93.1 fL (ref 80.0–100.0)
Platelets: 225 10*3/uL (ref 150–400)
RBC: 5.1 MIL/uL (ref 4.22–5.81)
RDW: 12.1 % (ref 11.5–15.5)
WBC: 11.2 10*3/uL — ABNORMAL HIGH (ref 4.0–10.5)
nRBC: 0 % (ref 0.0–0.2)

## 2021-12-03 LAB — LIPASE, BLOOD: Lipase: 26 U/L (ref 11–51)

## 2021-12-03 MED ORDER — SODIUM CHLORIDE 0.9 % IV BOLUS
1000.0000 mL | Freq: Once | INTRAVENOUS | Status: AC
  Administered 2021-12-03: 1000 mL via INTRAVENOUS

## 2021-12-03 MED ORDER — ONDANSETRON HCL 4 MG/2ML IJ SOLN: 4.0000 mg | Freq: Four times a day (QID) | INTRAMUSCULAR | Status: DC | PRN

## 2021-12-03 MED ORDER — ENOXAPARIN SODIUM 80 MG/0.8ML IJ SOSY
0.5000 mg/kg | PREFILLED_SYRINGE | INTRAMUSCULAR | Status: DC
  Administered 2021-12-03 – 2021-12-04 (×2): 72.5 mg via SUBCUTANEOUS
  Filled 2021-12-03 (×3): qty 0.72

## 2021-12-03 MED ORDER — PIPERACILLIN-TAZOBACTAM 3.375 G IVPB
3.3750 g | Freq: Three times a day (TID) | INTRAVENOUS | Status: DC
  Administered 2021-12-04 – 2021-12-05 (×4): 3.375 g via INTRAVENOUS
  Filled 2021-12-03 (×6): qty 50

## 2021-12-03 MED ORDER — ENOXAPARIN SODIUM 40 MG/0.4ML IJ SOSY: 40.0000 mg | PREFILLED_SYRINGE | INTRAMUSCULAR | Status: DC

## 2021-12-03 MED ORDER — MORPHINE SULFATE (PF) 4 MG/ML IV SOLN
4.0000 mg | Freq: Once | INTRAVENOUS | Status: AC
  Administered 2021-12-03: 4 mg via INTRAVENOUS
  Filled 2021-12-03: qty 1

## 2021-12-03 MED ORDER — ACETAMINOPHEN 650 MG RE SUPP: 650.0000 mg | Freq: Four times a day (QID) | RECTAL | Status: DC | PRN

## 2021-12-03 MED ORDER — HYDROCODONE-ACETAMINOPHEN 5-325 MG PO TABS
1.0000 | ORAL_TABLET | ORAL | Status: DC | PRN
  Administered 2021-12-04 – 2021-12-05 (×2): 1 via ORAL
  Filled 2021-12-03: qty 2
  Filled 2021-12-03 (×2): qty 1

## 2021-12-03 MED ORDER — ONDANSETRON 4 MG PO TBDP: 4.0000 mg | ORAL_TABLET | Freq: Four times a day (QID) | ORAL | Status: DC | PRN

## 2021-12-03 MED ORDER — MORPHINE SULFATE (PF) 4 MG/ML IV SOLN: 4.0000 mg | INTRAVENOUS | Status: DC | PRN

## 2021-12-03 MED ORDER — ONDANSETRON HCL 4 MG/2ML IJ SOLN
4.0000 mg | Freq: Once | INTRAMUSCULAR | Status: AC
  Administered 2021-12-03: 4 mg via INTRAVENOUS
  Filled 2021-12-03: qty 2

## 2021-12-03 MED ORDER — PIPERACILLIN-TAZOBACTAM 3.375 G IVPB 30 MIN
3.3750 g | Freq: Once | INTRAVENOUS | Status: AC
  Administered 2021-12-03: 3.375 g via INTRAVENOUS
  Filled 2021-12-03: qty 50

## 2021-12-03 MED ORDER — SODIUM CHLORIDE 0.9 % IV SOLN: INTRAVENOUS | Status: DC

## 2021-12-03 MED ORDER — ACETAMINOPHEN 325 MG PO TABS: 650.0000 mg | ORAL_TABLET | Freq: Four times a day (QID) | ORAL | Status: DC | PRN

## 2021-12-03 MED ORDER — ORAL CARE MOUTH RINSE: 15.0000 mL | OROMUCOSAL | Status: DC | PRN

## 2021-12-03 NOTE — ED Triage Notes (Signed)
Patient c/o abdominal pain that started today around 11am. Reports pain worse with ambulating. Denies N/V/D.

## 2021-12-03 NOTE — Progress Notes (Signed)
PHARMACIST - PHYSICIAN COMMUNICATION  CONCERNING:  Enoxaparin (Lovenox) for DVT Prophylaxis    RECOMMENDATION: Patient was prescribed enoxaprin 40mg  q24 hours for VTE prophylaxis.   Filed Weights   12/03/21 1800  Weight: (!) 145.2 kg (320 lb)    Body mass index is 37.95 kg/m.  Estimated Creatinine Clearance: 149.4 mL/min (by C-G formula based on SCr of 1.13 mg/dL).   Based on [facility] policy patient is candidate for enoxaparin 0.5mg /kg TBW SQ every 24 hours based on BMI being >30.  DESCRIPTION: Pharmacy has adjusted enoxaparin dose per [facility] policy.  Patient is now receiving enoxaparin 72.5 mg every 24 hours    [facility], PharmD Clinical Pharmacist  12/03/2021 6:56 PM

## 2021-12-03 NOTE — ED Provider Notes (Signed)
   [facility] Provider Note    Event Date/Time   First MD Initiated Contact with Patient 12/03/21 1836     (approximate)  History   Chief Complaint: Abdominal Pain  HPI  Tony Hubbard is a 31 y.o. male with no significant past medical history presents to the emergency department for right lower quadrant abdominal pain.  According to the patient since 11 AM this morning he has been experiencing right lower quadrant abdominal pain that has progressively worsened.  Patient states some nausea denies any vomiting or significant diarrhea.  No dysuria.  99.5 temperature in the emergency department.  Physical Exam   Triage Vital Signs: ED Triage Vitals  Enc Vitals Group     BP 12/03/21 1654 128/90     Pulse Rate 12/03/21 1654 89     Resp 12/03/21 1654 16     Temp 12/03/21 1654 99.5 F (37.5 C)     Temp Source 12/03/21 1654 Oral     SpO2 12/03/21 1654 97 %     Weight 12/03/21 1800 (!) 320 lb (145.2 kg)     Height 12/03/21 1800 6\' 5"  (1.956 m)     Head Circumference --      Peak Flow --      Pain Score 12/03/21 1800 5     Pain Loc --      Pain Edu? --      Excl. in [place]? --     Most recent vital signs: Vitals:   12/03/21 1654  BP: 128/90  Pulse: 89  Resp: 16  Temp: 99.5 F (37.5 C)  SpO2: 97%    General: Awake, no distress.  CV:  Good peripheral perfusion.  Regular rate and rhythm  Resp:  Normal effort.  Equal breath sounds bilaterally.  Abd:  No distention.  Soft, moderate right lower quadrant tenderness to palpation without rebound or guarding    ED Results / Procedures / Treatments   RADIOLOGY  I have reviewed and interpreted the CT images there does appear to be some inflammation in the right lower quadrant. CT scan read as early acute appendicitis.  No perforation.  MEDICATIONS ORDERED IN ED: Medications - No data to display   IMPRESSION / MDM / ASSESSMENT AND PLAN / ED COURSE  I reviewed the triage vital signs and the nursing  notes.  Patient's presentation is most consistent with acute presentation with potential threat to life or bodily function.  Patient presents emergency department for right lower quadrant abdominal pain worsening since 11 AM this morning.  Currently states moderate 5/10 pain but 8/10 if he is up and moving.  Patient's lab work shows a slight leukocytosis on his CBC overall reassuring chemistry and a normal lipase.  Urinalysis is normal.  Given the patient has moderate tenderness to palpation in the right lower quadrant we will obtain CT imaging.  CT scan consistent with early/acute appendicitis no perforation.  I spoke to Dr. 12/05/21 of general surgery who will be down to see the patient will be admitting to his service.  We will cover the patient with IV Zosyn treat pain nausea and IV hydrate while awaiting surgery admission.  Patient agreeable to plan of care  FINAL CLINICAL IMPRESSION(S) / ED DIAGNOSES   Acute appendicitis   Note:  This document was prepared using Dragon voice recognition software and may include unintentional dictation errors.   Maia Plan, MD 12/03/21 [phone]

## 2021-12-03 NOTE — H&P (Signed)
SURGICAL CONSULTATION NOTE   HISTORY OF PRESENT ILLNESS (HPI):  31 y.o. male presented to [facility] ED for evaluation of abdominal pain. Patient reports he started having abdominal pain this morning.  The pain started to intensify during the day.  He decided to come to the ED.  Pain initially started in the lower abdomen and now localized to the right lower quadrant.  No other pain radiation.  Aggravating factor is applying pressure.  No alleviating factors.  At the ED he was found with mild leukocytosis.  CT scan of the abdominal pelvis shows early acute appendicitis.  No free air or free fluid.  I personally evaluated the images.  Surgery is consulted by Dr. Lenard Lance in this context for evaluation and management of acute appendicitis.  PAST MEDICAL HISTORY (PMH):  History reviewed. No pertinent past medical history.   PAST SURGICAL HISTORY (PSH):  History reviewed. No pertinent surgical history.   MEDICATIONS:  Prior to Admission medications   Medication Sig Start Date End Date Taking? Authorizing Provider  benzonatate (TESSALON) 200 MG capsule Take 1 capsule (200 mg total) by mouth 2 (two) times daily as needed for cough. Patient not taking: Reported on 12/03/2021 10/02/20   Rodriguez-Southworth, Nettie Elm, PA-C  oseltamivir (TAMIFLU) 75 MG capsule Take 1 capsule (75 mg total) by mouth every 12 (twelve) hours. Patient not taking: Reported on 12/03/2021 10/02/20   Rodriguez-Southworth, Nettie Elm, PA-C     ALLERGIES:  No Known Allergies   SOCIAL HISTORY:  Social History   Socioeconomic History   Marital status: Married    Spouse name: Not on file   Number of children: Not on file   Years of education: Not on file   Highest education level: Not on file  Occupational History   Not on file  Tobacco Use   Smoking status: Never   Smokeless tobacco: Never  Vaping Use   Vaping Use: Never used  Substance and Sexual Activity   Alcohol use: No   Drug use: Never   Sexual activity: Not on  file  Other Topics Concern   Not on file  Social History Narrative   Not on file   Social Determinants of Health   Financial Resource Strain: Not on file  Food Insecurity: Not on file  Transportation Needs: Not on file  Physical Activity: Not on file  Stress: Not on file  Social Connections: Not on file  Intimate Partner Violence: Not on file      FAMILY HISTORY:  Family History  Problem Relation Age of Onset   Healthy Mother    Healthy Father      REVIEW OF SYSTEMS:  Constitutional: denies weight loss, fever, chills, or sweats  Eyes: denies any other vision changes, history of eye injury  ENT: denies sore throat, hearing problems  Respiratory: denies shortness of breath, wheezing  Cardiovascular: denies chest pain, palpitations  Gastrointestinal: positive abdominal pain, nausea and vomiting Genitourinary: denies burning with urination or urinary frequency Musculoskeletal: denies any other joint pains or cramps  Skin: denies any other rashes or skin discolorations  Neurological: denies any other headache, dizziness, weakness  Psychiatric: denies any other depression, anxiety   All other review of systems were negative   VITAL SIGNS:  Temp:  [98.5 F (36.9 C)-99.5 F (37.5 C)] 98.7 F (37.1 C) (07/24 2149) Pulse Rate:  [78-89] 78 (07/24 2149) Resp:  [16-18] 18 (07/24 2149) BP: (112-128)/(79-90) 112/81 (07/24 2149) SpO2:  [97 %-98 %] 98 % (07/24 2149) Weight:  [145.2 kg] 145.2  kg (07/24 1800)     Height: 6\' 5"  (195.6 cm) Weight: (!) 145.2 kg BMI (Calculated): 37.94   INTAKE/OUTPUT:  This shift: Total I/O In: 100 [IV Piggyback:100] Out: -   Last 2 shifts: @IOLAST2SHIFTS @   PHYSICAL EXAM:  Constitutional:  -- Normal body habitus  -- Awake, alert, and oriented x3  Eyes:  -- Pupils equally round and reactive to light  -- No scleral icterus  Ear, nose, and throat:  -- No jugular venous distension  Pulmonary:  -- No crackles  -- Equal breath sounds  bilaterally -- Breathing non-labored at rest Cardiovascular:  -- S1, S2 present  -- No pericardial rubs Gastrointestinal:  -- Abdomen soft, tender to palpation in the right lower quadrant, non-distended, no guarding or rebound tenderness -- No abdominal masses appreciated, pulsatile or otherwise  Musculoskeletal and Integumentary:  -- Wounds or skin discoloration: None appreciated -- Extremities: B/L UE and LE FROM, hands and feet warm, no edema  Neurologic:  -- Motor function: intact and symmetric -- Sensation: intact and symmetric   Labs:     Latest Ref Rng & Units 12/03/2021    6:02 PM  CBC  WBC 4.0 - 10.5 K/uL 11.2   Hemoglobin 13.0 - 17.0 g/dL   Hematocrit 12/05/2021 - 52.0 % 47.5   Platelets 150 - 400 K/uL 225       Latest Ref Rng & Units 12/03/2021    6:02 PM  CMP  Glucose 70 - 99 mg/dL 98   BUN 6 - 20 mg/dL 15   Creatinine 34.1 - 1.24 mg/dL 12/05/2021   Sodium 9.37 - 9.02 mmol/L 138   Potassium 3.5 - 5.1 mmol/L 3.8   Chloride 98 - 111 mmol/L 109   CO2 22 - 32 mmol/L 23   Calcium 8.9 - 10.3 mg/dL 9.3   Total Protein 6.5 - 8.1 g/dL 7.5   Total Bilirubin 0.3 - 1.2 mg/dL 0.9   Alkaline Phos 38 - 126 U/L 47   AST 15 - 41 U/L 25   ALT 0 - 44 U/L 31     Imaging studies:  EXAM: CT ABDOMEN AND PELVIS WITHOUT CONTRAST   TECHNIQUE: Multidetector CT imaging of the abdomen and pelvis was performed following the standard protocol without IV contrast.   RADIATION DOSE REDUCTION: This exam was performed according to the departmental dose-optimization program which includes automated exposure control, adjustment of the mA and/or kV according to patient size and/or use of iterative reconstruction technique.   COMPARISON:  None Available.   FINDINGS: Lower chest: Lung bases are clear.   Hepatobiliary: No focal hepatic lesion. No biliary duct dilatation. Common bile duct is normal.   Pancreas: Pancreas is normal. No ductal dilatation. No pancreatic inflammation.    Spleen: Normal spleen   Adrenals/urinary tract: Adrenal glands and kidneys are normal. The ureters and bladder normal.   Stomach/Bowel: Small hiatal hernia. Stomach normal. Duodenum small-bowel normal. There are several appendicoliths within the lumen of the appendix (best seen on coronal image 43/5). There is subtle inflammation within the peri appendiceal fat (image 76/2). Appendix is mildly distended to 10 mm. No perforation or abscess. Findings most consistent with mild acute appendicitis.   Appendix: Location: Typical location  inferior medial to the cecum.   Diameter: 10 mm   Appendicolith: present   Mucosal hyper-enhancement:  minimal   Extraluminal Gas: None   Periappendiceal Collection: None   Vascular/Lymphatic: Abdominal aorta is normal caliber. No periportal or retroperitoneal adenopathy. No pelvic adenopathy.  Reproductive: Prostate normal   Other: No free fluid.   Musculoskeletal: No aggressive osseous lesion.   IMPRESSION: 1. Mild/early acute appendicitis as above. 2. No perforation or abscess.     Electronically Signed   By: Genevive Bi M.D.   On: 12/03/2021 18:38  Assessment/Plan:  31 y.o. male with acute appendicitis.  Patient with history, physical exam and images consistent with acute appendicitis. Patient oriented about diagnosis and surgical management as treatment. Patient oriented about goals of surgery and its risk including: bowel injury, infection, abscess, bleeding, leak from cecum, intestinal adhesions, bowel obstruction, fistula, injury to the ureter among others.  Patient understood and agreed to proceed with surgery. Will admit patient overnight, start on antibiotic therapy and pain medications, will give IV hydration since patient is NPO and schedule to OR tomorrow.   Gae Gallop, MD

## 2021-12-03 NOTE — ED Provider Triage Note (Signed)
Emergency Medicine Provider Triage Evaluation Note  KNUT RONDINELLI , a 31 y.o. male  was evaluated in triage.  Pt complains of lower abdominal pain.  Review of Systems  Positive: Lower abdominal pain Negative: Fever  Physical Exam  BP 128/90 (BP Location: Right Arm)   Pulse 89   Temp 99.5 F (37.5 C) (Oral)   Resp 16   Ht 6\' 5"  (1.956 m)   Wt (!) 145.2 kg   SpO2 97%   BMI 37.95 kg/m  Gen:   Awake, no distress   Resp:  Normal effort  MSK:   Moves extremities without difficulty  Other:    Medical Decision Making  Medically screening exam initiated at 6:13 PM.  Appropriate orders placed.  Matheney was informed that the remainder of the evaluation will be completed by another provider, this initial triage assessment does not replace that evaluation, and the importance of remaining in the ED until their evaluation is complete.  CT abdomen pelvis without contrast ordered, patient is tender in right lower quadrant   Carlisle Beers, PA-C 12/03/21 1814

## 2021-12-04 ENCOUNTER — Observation Stay: Payer: BC Managed Care – PPO | Admitting: Anesthesiology

## 2021-12-04 ENCOUNTER — Encounter: Payer: Self-pay | Admitting: General Surgery

## 2021-12-04 ENCOUNTER — Other Ambulatory Visit: Payer: Self-pay

## 2021-12-04 ENCOUNTER — Encounter
Admission: EM | Disposition: A | Discharge status: Home / Self Care | Payer: Self-pay | Source: Home / Self Care | Attending: Emergency Medicine

## 2021-12-04 HISTORY — PX: XI ROBOTIC LAPAROSCOPIC ASSISTED APPENDECTOMY: SHX6877

## 2021-12-04 LAB — SURGICAL PCR SCREEN
MRSA, PCR: NEGATIVE
Staphylococcus aureus: NEGATIVE

## 2021-12-04 LAB — HIV ANTIBODY (ROUTINE TESTING W REFLEX): HIV Screen 4th Generation wRfx: NONREACTIVE

## 2021-12-04 SURGERY — APPENDECTOMY, ROBOT-ASSISTED, LAPAROSCOPIC
Anesthesia: General | Site: Abdomen

## 2021-12-04 MED ORDER — ONDANSETRON HCL 4 MG/2ML IJ SOLN
INTRAMUSCULAR | Status: AC
  Filled 2021-12-04: qty 2

## 2021-12-04 MED ORDER — BUPIVACAINE-EPINEPHRINE (PF) 0.5% -1:200000 IJ SOLN
INTRAMUSCULAR | Status: AC
  Filled 2021-12-04: qty 30

## 2021-12-04 MED ORDER — FENTANYL CITRATE (PF) 100 MCG/2ML IJ SOLN
INTRAMUSCULAR | Status: AC
  Filled 2021-12-04: qty 2

## 2021-12-04 MED ORDER — LACTATED RINGERS IV SOLN: INTRAVENOUS | Status: DC | PRN

## 2021-12-04 MED ORDER — BUPIVACAINE-EPINEPHRINE (PF) 0.5% -1:200000 IJ SOLN
INTRAMUSCULAR | Status: DC | PRN
  Administered 2021-12-04: 10 mL via PERINEURAL

## 2021-12-04 MED ORDER — PROPOFOL 10 MG/ML IV BOLUS
INTRAVENOUS | Status: AC
  Filled 2021-12-04: qty 40

## 2021-12-04 MED ORDER — PROPOFOL 10 MG/ML IV BOLUS
INTRAVENOUS | Status: DC | PRN
  Administered 2021-12-04: 50 mg via INTRAVENOUS
  Administered 2021-12-04: 200 mg via INTRAVENOUS

## 2021-12-04 MED ORDER — FENTANYL CITRATE (PF) 100 MCG/2ML IJ SOLN
INTRAMUSCULAR | Status: AC
  Administered 2021-12-04: 50 ug via INTRAVENOUS
  Filled 2021-12-04: qty 2

## 2021-12-04 MED ORDER — ACETAMINOPHEN 10 MG/ML IV SOLN
INTRAVENOUS | Status: DC | PRN
  Administered 2021-12-04: 1000 mg via INTRAVENOUS

## 2021-12-04 MED ORDER — ROCURONIUM BROMIDE 10 MG/ML (PF) SYRINGE
PREFILLED_SYRINGE | INTRAVENOUS | Status: AC
  Filled 2021-12-04: qty 10

## 2021-12-04 MED ORDER — LACTATED RINGERS IV SOLN: INTRAVENOUS | Status: DC

## 2021-12-04 MED ORDER — DEXMEDETOMIDINE HCL IN NACL 80 MCG/20ML IV SOLN
INTRAVENOUS | Status: AC
  Filled 2021-12-04: qty 20

## 2021-12-04 MED ORDER — FENTANYL CITRATE (PF) 100 MCG/2ML IJ SOLN
25.0000 ug | INTRAMUSCULAR | Status: DC | PRN
  Administered 2021-12-04: 50 ug via INTRAVENOUS
  Administered 2021-12-04: 25 ug via INTRAVENOUS

## 2021-12-04 MED ORDER — SUGAMMADEX SODIUM 500 MG/5ML IV SOLN
INTRAVENOUS | Status: DC | PRN
  Administered 2021-12-04: 400 mg via INTRAVENOUS

## 2021-12-04 MED ORDER — DEXAMETHASONE SODIUM PHOSPHATE 10 MG/ML IJ SOLN
INTRAMUSCULAR | Status: AC
  Filled 2021-12-04: qty 1

## 2021-12-04 MED ORDER — ACETAMINOPHEN 10 MG/ML IV SOLN: 1000.0000 mg | Freq: Once | INTRAVENOUS | Status: DC | PRN

## 2021-12-04 MED ORDER — ACETAMINOPHEN 10 MG/ML IV SOLN
INTRAVENOUS | Status: AC
Start: 2021-12-04 — End: ?
  Filled 2021-12-04: qty 100

## 2021-12-04 MED ORDER — MIDAZOLAM HCL 2 MG/2ML IJ SOLN
INTRAMUSCULAR | Status: AC
  Filled 2021-12-04: qty 2

## 2021-12-04 MED ORDER — SUCCINYLCHOLINE CHLORIDE 200 MG/10ML IV SOSY
PREFILLED_SYRINGE | INTRAVENOUS | Status: AC
  Filled 2021-12-04: qty 10

## 2021-12-04 MED ORDER — DEXAMETHASONE SODIUM PHOSPHATE 10 MG/ML IJ SOLN
INTRAMUSCULAR | Status: DC | PRN
  Administered 2021-12-04: 10 mg via INTRAVENOUS

## 2021-12-04 MED ORDER — MIDAZOLAM HCL 2 MG/2ML IJ SOLN
INTRAMUSCULAR | Status: DC | PRN
  Administered 2021-12-04: 2 mg via INTRAVENOUS

## 2021-12-04 MED ORDER — OXYCODONE HCL 5 MG PO TABS
5.0000 mg | ORAL_TABLET | Freq: Once | ORAL | Status: AC | PRN
  Administered 2021-12-04: 5 mg via ORAL

## 2021-12-04 MED ORDER — SUCCINYLCHOLINE CHLORIDE 200 MG/10ML IV SOSY
PREFILLED_SYRINGE | INTRAVENOUS | Status: DC | PRN
  Administered 2021-12-04: 120 mg via INTRAVENOUS

## 2021-12-04 MED ORDER — DEXMEDETOMIDINE (PRECEDEX) IN NS 20 MCG/5ML (4 MCG/ML) IV SYRINGE
PREFILLED_SYRINGE | INTRAVENOUS | Status: DC | PRN
  Administered 2021-12-04: 8 ug via INTRAVENOUS
  Administered 2021-12-04: 4 ug via INTRAVENOUS
  Administered 2021-12-04: 8 ug via INTRAVENOUS

## 2021-12-04 MED ORDER — ROCURONIUM BROMIDE 100 MG/10ML IV SOLN
INTRAVENOUS | Status: DC | PRN
  Administered 2021-12-04: 20 mg via INTRAVENOUS
  Administered 2021-12-04: 10 mg via INTRAVENOUS
  Administered 2021-12-04: 60 mg via INTRAVENOUS

## 2021-12-04 MED ORDER — ONDANSETRON HCL 4 MG/2ML IJ SOLN
INTRAMUSCULAR | Status: DC | PRN
  Administered 2021-12-04: 4 mg via INTRAVENOUS

## 2021-12-04 MED ORDER — LIDOCAINE HCL (CARDIAC) PF 100 MG/5ML IV SOSY
PREFILLED_SYRINGE | INTRAVENOUS | Status: DC | PRN
  Administered 2021-12-04: 100 mg via INTRAVENOUS

## 2021-12-04 MED ORDER — OXYCODONE HCL 5 MG PO TABS
ORAL_TABLET | ORAL | Status: AC
  Filled 2021-12-04: qty 1

## 2021-12-04 MED ORDER — 0.9 % SODIUM CHLORIDE (POUR BTL) OPTIME
TOPICAL | Status: DC | PRN
  Administered 2021-12-04: 500 mL

## 2021-12-04 MED ORDER — LIDOCAINE HCL (PF) 2 % IJ SOLN
INTRAMUSCULAR | Status: AC
  Filled 2021-12-04: qty 5

## 2021-12-04 MED ORDER — OXYCODONE HCL 5 MG/5ML PO SOLN: 5.0000 mg | Freq: Once | ORAL | Status: AC | PRN

## 2021-12-04 MED ORDER — FENTANYL CITRATE (PF) 100 MCG/2ML IJ SOLN
INTRAMUSCULAR | Status: DC | PRN
  Administered 2021-12-04 (×2): 50 ug via INTRAVENOUS

## 2021-12-04 MED ORDER — SUGAMMADEX SODIUM 500 MG/5ML IV SOLN
INTRAVENOUS | Status: AC
Start: 2021-12-04 — End: ?
  Filled 2021-12-04: qty 5

## 2021-12-04 MED ORDER — ONDANSETRON HCL 4 MG/2ML IJ SOLN: 4.0000 mg | Freq: Once | INTRAMUSCULAR | Status: DC | PRN

## 2021-12-04 SURGICAL SUPPLY — 64 items
ADH SKN CLS APL DERMABOND .7 (GAUZE/BANDAGES/DRESSINGS) ×1
BAG PRESSURE INF REUSE 1000 (BAG) IMPLANT
BLADE SURG SZ11 CARB STEEL (BLADE) ×3 IMPLANT
CANNULA REDUC XI 12-8 STAPL (CANNULA) ×2
CANNULA REDUCER 12-8 DVNC XI (CANNULA) ×2 IMPLANT
COVER TIP SHEARS 8 DVNC (MISCELLANEOUS) ×2 IMPLANT
COVER TIP SHEARS 8MM DA VINCI (MISCELLANEOUS) ×2
DERMABOND ADVANCED (GAUZE/BANDAGES/DRESSINGS) ×1
DERMABOND ADVANCED .7 DNX12 (GAUZE/BANDAGES/DRESSINGS) ×2 IMPLANT
DRAPE ARM DVNC X/XI (DISPOSABLE) ×8 IMPLANT
DRAPE COLUMN DVNC XI (DISPOSABLE) ×2 IMPLANT
DRAPE DA VINCI XI ARM (DISPOSABLE) ×8
DRAPE DA VINCI XI COLUMN (DISPOSABLE) ×2
ELECT REM PT RETURN 9FT ADLT (ELECTROSURGICAL) ×2
ELECTRODE REM PT RTRN 9FT ADLT (ELECTROSURGICAL) ×2 IMPLANT
GLOVE BIOGEL PI IND STRL 6.5 (GLOVE) ×4 IMPLANT
GLOVE BIOGEL PI INDICATOR 6.5 (GLOVE) ×2
GLOVE SURG SYN 6.5 ES PF (GLOVE) ×4 IMPLANT
GLOVE SURG SYN 6.5 PF PI (GLOVE) ×4 IMPLANT
GOWN STRL REUS W/ TWL LRG LVL3 (GOWN DISPOSABLE) ×6 IMPLANT
GOWN STRL REUS W/TWL LRG LVL3 (GOWN DISPOSABLE) ×6
GRASPER SUT TROCAR 14GX15 (MISCELLANEOUS) ×1 IMPLANT
IRRIGATOR SUCT 8 DISP DVNC XI (IRRIGATION / IRRIGATOR) IMPLANT
IRRIGATOR SUCTION 8MM XI DISP (IRRIGATION / IRRIGATOR)
KIT PINK PAD W/HEAD ARE REST (MISCELLANEOUS) ×2 IMPLANT
KIT PINK PAD W/HEAD ARM REST (MISCELLANEOUS) ×2 IMPLANT
LABEL OR SOLS (LABEL) IMPLANT
MANIFOLD NEPTUNE II (INSTRUMENTS) ×3 IMPLANT
NDL INSUFFLATION 14GA 120MM (NEEDLE) ×2 IMPLANT
NEEDLE HYPO 22GX1.5 SAFETY (NEEDLE) ×3 IMPLANT
NEEDLE INSUFFLATION 14GA 120MM (NEEDLE) ×2 IMPLANT
OBTURATOR OPTICAL STANDARD 8MM (TROCAR) ×2
OBTURATOR OPTICAL STND 8 DVNC (TROCAR) ×1
OBTURATOR OPTICALSTD 8 DVNC (TROCAR) ×2 IMPLANT
PACK LAP CHOLECYSTECTOMY (MISCELLANEOUS) ×3 IMPLANT
RELOAD STAPLE 45 2.5 WHT DVNC (STAPLE) IMPLANT
RELOAD STAPLE 45 3.5 BLU DVNC (STAPLE) ×2 IMPLANT
RELOAD STAPLER 2.5X45 WHT DVNC (STAPLE) IMPLANT
RELOAD STAPLER 3.5X45 BLU DVNC (STAPLE) IMPLANT
SEAL CANN UNIV 5-8 DVNC XI (MISCELLANEOUS) ×6 IMPLANT
SEAL XI 5MM-8MM UNIVERSAL (MISCELLANEOUS) ×6
SEALER VESSEL DA VINCI XI (MISCELLANEOUS)
SEALER VESSEL EXT DVNC XI (MISCELLANEOUS) IMPLANT
SET TUBE SMOKE EVAC HIGH FLOW (TUBING) ×3 IMPLANT
SOLUTION ELECTROLUBE (MISCELLANEOUS) ×3 IMPLANT
SPONGE T-LAP 4X18 ~~LOC~~+RFID (SPONGE) ×3 IMPLANT
STAPLER 45 DA VINCI SURE FORM (STAPLE)
STAPLER 45 SUREFORM DVNC (STAPLE) ×2 IMPLANT
STAPLER CANNULA SEAL DVNC XI (STAPLE) ×2 IMPLANT
STAPLER CANNULA SEAL XI (STAPLE) ×2
STAPLER RELOAD 2.5X45 WHITE (STAPLE)
STAPLER RELOAD 2.5X45 WHT DVNC (STAPLE)
STAPLER RELOAD 3.5X45 BLU DVNC (STAPLE)
STAPLER RELOAD 3.5X45 BLUE (STAPLE)
SUT MNCRL AB 4-0 PS2 18 (SUTURE) ×4 IMPLANT
SUT VIC AB 2-0 SH 27 (SUTURE)
SUT VIC AB 2-0 SH 27XBRD (SUTURE) ×2 IMPLANT
SUT VICRYL 0 AB UR-6 (SUTURE) ×3 IMPLANT
SUT VLOC 90 6 CV-15 VIOLET (SUTURE) ×3 IMPLANT
SYR 30ML LL (SYRINGE) ×3 IMPLANT
SYS BAG RETRIEVAL 10MM (BASKET) ×2
SYSTEM BAG RETRIEVAL 10MM (BASKET) ×2 IMPLANT
TRAY FOLEY MTR SLVR 16FR STAT (SET/KITS/TRAYS/PACK) IMPLANT
WATER STERILE IRR 500ML POUR (IV SOLUTION) ×2 IMPLANT

## 2021-12-04 NOTE — Interval H&P Note (Signed)
History and Physical Interval Note:  12/04/2021 11:34 AM  Tony Hubbard  has presented today for surgery, with the diagnosis of Acute appendicitis.  The various methods of treatment have been discussed with the patient and family. After consideration of risks, benefits and other options for treatment, the patient has consented to  Procedure(s): XI ROBOTIC LAPAROSCOPIC ASSISTED APPENDECTOMY (N/A) as a surgical intervention.  The patient's history has been reviewed, patient examined, no change in status, stable for surgery.  I have reviewed the patient's chart and labs.  Questions were answered to the patient's satisfaction.     Carolan Shiver

## 2021-12-04 NOTE — Op Note (Signed)
Pre-op Diagnosis: Acute appendicitis   Post op Diagnosis: Acute appenditicis  Procedure: Robotic assisted laparoscopic appendectomy.  Anesthesia: GETA  Surgeon: Carolan Shiver, MD, FACS  Wound Classification: clean contaminated  Specimen: Appendix  Complications: None  Estimated Blood Loss: 3 mL   Indications: Patient is a 31 y.o. male  presented with above right lower quadrant pain. CT scan shows acute appendicitis.     FIndings: 1.  Suppurative appendicitis 2. No peri-appendiceal abscess or phlegmon 3. Normal anatomy 4. Adequate hemostasis.   Description of procedure: The patient was placed on the operating table in the supine position. General anesthesia was induced. A time-out was completed verifying correct patient, procedure, site, positioning, and implant(s) and/or special equipment prior to beginning this procedure. The abdomen was prepped and draped in the usual sterile fashion.   Palmer's point located and Veress needle was inserted.  After confirming 2 clicks and a positive saline drop test, gas insufflation was initiated until the abdominal pressure was measured at 15 mmHg.  Afterwards, the Veress needle was removed and a 8 mm port was placed in left upper quadrant area using Optiview technique.  After local was infused, 3 additional incision on the left abdominal wall were made 5 cm apart.  An 12 mm port and two other 8 mm ports were placed under direct visualization.  No injuries from trocar placements were noted.  The table was placed in the Trendelenburg position with the right side elevated.  With the use of Tip up grasper, fenestrated bipolar and monopolar scissors, an inflamed appendix was identified and elevated.  Window created at base of appendix in the mesentery.    The mesoappendix was divided with combination of bipolar energy and monopolar scissors.  The base of the appendix was ligated with 3-0 V-Loc.  The appendix was divided with monopolar scissors.   A second layer of the 3 oh V-Loc was done over the appendiceal stump.  The appendiceal stump was examined and hemostasis noted. No other pathology was identified within pelvis. The 12 mm trocar removed and port site closed with PMI using 0 vicryl under direct vision. Remaining trocars were removed under direct vision. No bleeding was noted.The abdomen was allowed to collapse.  All skin incisions then closed with subcuticular sutures Monocryl 4-0.  Wounds then dressed with dermabond.  The patient tolerated the procedure well, awakened from anesthesia and was taken to the postanesthesia care unit in satisfactory condition.  Sponge count and instrument count correct at the end of the procedure.

## 2021-12-04 NOTE — Anesthesia Postprocedure Evaluation (Signed)
Anesthesia Post Note  Patient: Tony Hubbard  Procedure(s) Performed: XI ROBOTIC LAPAROSCOPIC ASSISTED APPENDECTOMY (Abdomen)  Patient location during evaluation: PACU Anesthesia Type: General Level of consciousness: awake and alert, oriented and patient cooperative Pain management: pain level controlled Vital Signs Assessment: post-procedure vital signs reviewed and stable Respiratory status: spontaneous breathing, nonlabored ventilation and respiratory function stable Cardiovascular status: blood pressure returned to baseline and stable Postop Assessment: adequate PO intake Anesthetic complications: no   No notable events documented.   Last Vitals:  Vitals:   12/04/21 1344 12/04/21 1411  BP: 113/72 111/63  Pulse: 77 72  Resp: 16   Temp:    SpO2: 92% 95%    Last Pain:  Vitals:   12/04/21 1344  TempSrc:   PainSc: Asleep                 Reed Breech

## 2021-12-04 NOTE — Transfer of Care (Signed)
Immediate Anesthesia Transfer of Care Note  Patient: Tony Hubbard  Procedure(s) Performed: XI ROBOTIC LAPAROSCOPIC ASSISTED APPENDECTOMY (Abdomen)  Patient Location: PACU  Anesthesia Type:General  Level of Consciousness: awake  Airway & Oxygen Therapy: Patient Spontanous Breathing and Patient connected to face mask oxygen  Post-op Assessment: Report given to RN and Post -op Vital signs reviewed and stable  Post vital signs: Reviewed and stable  Last Vitals:  Vitals Value Taken Time  BP 146/97 12/04/21 1305  Temp    Pulse 85 12/04/21 1306  Resp 21 12/04/21 1308  SpO2 97 % 12/04/21 1306  Vitals shown include unvalidated device data.  Last Pain:  Vitals:   12/04/21 1109  TempSrc: Temporal  PainSc: 7       Patients Stated Pain Goal: 0 (12/04/21 1109)  Complications: No notable events documented.

## 2021-12-04 NOTE — Anesthesia Procedure Notes (Signed)
Procedure Name: Intubation Date/Time: 12/04/2021 11:50 AM  Performed by: Irving Burton, CRNAPre-anesthesia Checklist: Patient identified, Patient being monitored, Timeout performed, Emergency Drugs available and Suction available Patient Re-evaluated:Patient Re-evaluated prior to induction Oxygen Delivery Method: Circle system utilized Preoxygenation: Pre-oxygenation with 100% oxygen Induction Type: IV induction Ventilation: Mask ventilation without difficulty Laryngoscope Size: McGraph and 4 Grade View: Grade I Tube type: Oral Tube size: 7.5 mm Number of attempts: 1 Airway Equipment and Method: Stylet and Video-laryngoscopy Placement Confirmation: ETT inserted through vocal cords under direct vision, positive ETCO2 and breath sounds checked- equal and bilateral Secured at: 23 cm Tube secured with: Tape Dental Injury: Teeth and Oropharynx as per pre-operative assessment

## 2021-12-04 NOTE — Anesthesia Preprocedure Evaluation (Addendum)
Anesthesia Evaluation  Patient identified by MRN, date of birth, ID band Patient awake    Reviewed: Allergy & Precautions, NPO status , Patient's Chart, lab work & pertinent test results  History of Anesthesia Complications Negative for: history of anesthetic complications  Airway Mallampati: IV   Neck ROM: Full    Dental no notable dental hx.    Pulmonary neg pulmonary ROS,    Pulmonary exam normal breath sounds clear to auscultation       Cardiovascular Exercise Tolerance: Good negative cardio ROS Normal cardiovascular exam Rhythm:Regular Rate:Normal     Neuro/Psych negative neurological ROS     GI/Hepatic negative GI ROS,   Endo/Other  Obesity   Renal/GU negative Renal ROS     Musculoskeletal   Abdominal   Peds  Hematology negative hematology ROS (+)   Anesthesia Other Findings   Reproductive/Obstetrics                            Anesthesia Physical Anesthesia Plan  ASA: 2 and emergent  Anesthesia Plan: General   Post-op Pain Management:    Induction: Intravenous and Rapid sequence  PONV Risk Score and Plan: 2 and Ondansetron, Dexamethasone and Treatment may vary due to age or medical condition  Airway Management Planned: Oral ETT  Additional Equipment:   Intra-op Plan:   Post-operative Plan: Extubation in OR  Informed Consent: I have reviewed the patients History and Physical, chart, labs and discussed the procedure including the risks, benefits and alternatives for the proposed anesthesia with the patient or authorized representative who has indicated his/her understanding and acceptance.     Dental advisory given  Plan Discussed with: CRNA  Anesthesia Plan Comments: (Patient consented for risks of anesthesia including but not limited to:  - adverse reactions to medications - damage to eyes, teeth, lips or other oral mucosa - nerve damage due to positioning  -  sore throat or hoarseness - damage to heart, brain, nerves, lungs, other parts of body or loss of life  Informed patient about role of CRNA in peri- and intra-operative care.  Patient voiced understanding.)        Anesthesia Quick Evaluation

## 2021-12-05 ENCOUNTER — Encounter: Payer: Self-pay | Admitting: General Surgery

## 2021-12-05 LAB — SURGICAL PATHOLOGY

## 2021-12-05 MED ORDER — HYDROCODONE-ACETAMINOPHEN 5-325 MG PO TABS: 1.0000 | ORAL_TABLET | ORAL | 0 refills | Status: AC | PRN

## 2021-12-05 NOTE — Discharge Summary (Signed)
  Patient ID: Tony Hubbard MRN: [id number] DOB/AGE: [birth year] 32 y.o.  Admit date: 12/03/2021 Discharge date: 12/05/2021   Discharge Diagnoses:  Principal Problem:   Acute appendicitis with localized peritonitis   Procedures: Robotic assisted laparoscopic appendectomy  Hospital Course: Patient admitted with acute appendicitis.  He underwent robotic appendectomy.  He tolerated the procedure well.  He has been ambulating.  He tolerated diet.  Pain controlled.  Wounds are dry and clean.  Physical Exam Vitals reviewed.  HENT:     Head: Normocephalic.  Cardiovascular:     Rate and Rhythm: Normal rate and regular rhythm.  Pulmonary:     Effort: Pulmonary effort is normal.     Breath sounds: Normal breath sounds.  Abdominal:     General: Abdomen is flat. Bowel sounds are normal.     Palpations: Abdomen is soft.  Skin:    Capillary Refill: Capillary refill takes less than 2 seconds.  Neurological:     Mental Status: He is alert and oriented to person, place, and time.      Consults: None  Disposition: Discharge disposition: 01-Home or Self Care       Discharge Instructions     Diet - low sodium heart healthy   Complete by: As directed    Increase activity slowly   Complete by: As directed       Allergies as of 12/05/2021   No Known Allergies      Medication List     TAKE these medications    benzonatate 200 MG capsule Commonly known as: TESSALON Take 1 capsule (200 mg total) by mouth 2 (two) times daily as needed for cough.   HYDROcodone-acetaminophen 5-325 MG tablet Commonly known as: NORCO/VICODIN Take 1-2 tablets by mouth every 4 (four) hours as needed for moderate pain.   oseltamivir 75 MG capsule Commonly known as: TAMIFLU Take 1 capsule (75 mg total) by mouth every 12 (twelve) hours.        Follow-up Information     Carolan Shiver, MD Follow up in 2 week(s).   Specialty: General Surgery Why: Follow up after appendectomy Contact  information: [address] [place] [address] [phone]

## 2021-12-05 NOTE — Progress Notes (Signed)
Discharge instructions were given to pt. Questions were answered. IV was DC. Pt denies pain at this time. Pt will let RN know when his ride gets here.

## 2021-12-05 NOTE — Plan of Care (Signed)

## 2021-12-05 NOTE — Discharge Instructions (Signed)

## 2021-12-05 NOTE — TOC CM/SW Note (Signed)
Patient has orders to discharge home today. Chart reviewed. On room air. Has incision on abdomen (dermabond). No TOC needs identified. CSW signing off.  Charlynn Court, CSW [phone]

## 2023-02-19 ENCOUNTER — Ambulatory Visit: Payer: BC Managed Care – PPO

## 2023-02-19 DIAGNOSIS — K449 Diaphragmatic hernia without obstruction or gangrene: Secondary | ICD-10-CM | POA: Diagnosis not present

## 2023-02-19 DIAGNOSIS — K222 Esophageal obstruction: Secondary | ICD-10-CM | POA: Diagnosis present

## 2023-09-23 ENCOUNTER — Emergency Department
Admission: EM | Admit: 2023-09-23 | Discharge: 2023-09-23 | Disposition: A | Discharge status: Home / Self Care | Attending: Emergency Medicine | Admitting: Emergency Medicine

## 2023-09-23 ENCOUNTER — Other Ambulatory Visit: Payer: Self-pay

## 2023-09-23 DIAGNOSIS — S60459A Superficial foreign body of unspecified finger, initial encounter: Secondary | ICD-10-CM

## 2023-09-23 DIAGNOSIS — W458XXA Other foreign body or object entering through skin, initial encounter: Secondary | ICD-10-CM | POA: Diagnosis not present

## 2023-09-23 DIAGNOSIS — S60454A Superficial foreign body of right ring finger, initial encounter: Secondary | ICD-10-CM | POA: Insufficient documentation

## 2023-09-23 DIAGNOSIS — Y9389 Activity, other specified: Secondary | ICD-10-CM | POA: Insufficient documentation

## 2023-09-23 MED ORDER — LIDOCAINE HCL (PF) 1 % IJ SOLN
10.0000 mL | Freq: Once | INTRAMUSCULAR | Status: AC
  Administered 2023-09-23: 10 mL
  Filled 2023-09-23: qty 10

## 2023-09-23 NOTE — ED Provider Notes (Signed)
 [facility] Provider Note    Event Date/Time   First MD Initiated Contact with Patient 09/23/23 2043     (approximate)   History   Chief Complaint Nail Problem   HPI  Tony Hubbard is a 33 y.o. male with no significant past medical history who presents to the ED complaining of nail problem.  Patient reports that just prior to arrival he was trying to wrap up some string lights on his porch when a piece of a paint chip got stuck under the nail of his right ring finger.  He reports significant pain associated with this and was unable to remove the paint chip using tweezers at home.     Physical Exam   Triage Vital Signs: ED Triage Vitals  Encounter Vitals Group     BP 09/23/23 2002 (!) 133/93     Systolic BP Percentile --      Diastolic BP Percentile --      Pulse Rate 09/23/23 2002 86     Resp 09/23/23 2002 18     Temp 09/23/23 2002 98.5 F (36.9 C)     Temp Source 09/23/23 2002 Oral     SpO2 09/23/23 2002 99 %     Weight 09/23/23 1959 (!) 320 lb (145.2 kg)     Height 09/23/23 1959 6\' 5"  (1.956 m)     Head Circumference --      Peak Flow --      Pain Score 09/23/23 1958 8     Pain Loc --      Pain Education --      Exclude from Growth Chart --     Most recent vital signs: Vitals:   09/23/23 2002  BP: (!) 133/93  Pulse: 86  Resp: 18  Temp: 98.5 F (36.9 C)  SpO2: 99%    Constitutional: Alert and oriented. Eyes: Conjunctivae are normal. Head: Atraumatic. Nose: No congestion/rhinnorhea. Mouth/Throat: Mucous membranes are moist.  Cardiovascular: Normal rate, regular rhythm. Grossly normal heart sounds.  2+ radial pulses bilaterally. Respiratory: Normal respiratory effort.  No retractions. Lungs CTAB. Gastrointestinal: Soft and nontender. No distention. Musculoskeletal: No lower extremity tenderness nor edema.  Small foreign body noted under nail of right ring finger. Neurologic:  Normal speech and language. No gross focal  neurologic deficits are appreciated.    ED Results / Procedures / Treatments   Labs (all labs ordered are listed, but only abnormal results are displayed) Labs Reviewed - No data to display   PROCEDURES:  Critical Care performed: No  .Foreign Body Removal  Date/Time: 09/23/2023 11:06 PM  Performed by: Twilla Galea, MD Authorized by: Twilla Galea, MD  Consent: Verbal consent obtained. Risks and benefits: risks, benefits and alternatives were discussed Consent given by: patient Patient identity confirmed: verbally with patient and arm band Body area: skin General location: upper extremity Location details: right ring finger Anesthesia: digital block  Anesthesia: Local Anesthetic: lidocaine  1% without epinephrine  Anesthetic total: 6 mL  Sedation: Patient sedated: no  Removal mechanism: forceps Depth: subcutaneous Complexity: simple 1 objects recovered. Objects recovered: Paint sliver Post-procedure assessment: foreign body removed Patient tolerance: patient tolerated the procedure well with no immediate complications     MEDICATIONS ORDERED IN ED: Medications  lidocaine  (PF) (XYLOCAINE ) 1 % injection 10 mL (10 mLs Other Given 09/23/23 2236)     IMPRESSION / MDM / ASSESSMENT AND PLAN / ED COURSE  I reviewed the triage vital signs and the nursing notes.  33 y.o. male with no significant past medical history presents to the ED with pain following foreign body becoming stuck under nail of right ring finger.  Patient's presentation is most consistent with acute, uncomplicated illness.  Differential diagnosis includes, but is not limited to, foreign body, laceration, nailbed injury.  Patient well-appearing and in no acute distress, vital signs are unremarkable.  Finger was anesthetized with digital block and foreign body was successfully removed after his hand was soaked in warm water.  Patient then rinsed the finger and was  counseled to keep the area clean.  He was counseled to return to the ED for new or worsening symptoms, patient agrees with plan.      FINAL CLINICAL IMPRESSION(S) / ED DIAGNOSES   Final diagnoses:  Foreign body in skin of finger, initial encounter     Rx / DC Orders   ED Discharge Orders     None        Note:  This document was prepared using Dragon voice recognition software and may include unintentional dictation errors.   Twilla Galea, MD 09/23/23 [phone]

## 2023-09-23 NOTE — ED Triage Notes (Addendum)
 Pt to ED via POV c/o nail problem on right ring finger. Pt went to reach for something when paint chip went into underneath nail, all the way through. Pt reports trying to take nail off.

## 2024-02-17 ENCOUNTER — Ambulatory Visit

## 2024-02-17 DIAGNOSIS — Z23 Encounter for immunization: Secondary | ICD-10-CM

## 2024-04-01 ENCOUNTER — Other Ambulatory Visit: Payer: Self-pay | Admitting: Family Medicine

## 2024-04-01 DIAGNOSIS — M5416 Radiculopathy, lumbar region: Secondary | ICD-10-CM

## 2024-04-07 ENCOUNTER — Other Ambulatory Visit

## 2024-04-10 ENCOUNTER — Ambulatory Visit
Admission: RE | Admit: 2024-04-10 | Discharge: 2024-04-10 | Disposition: A | Discharge status: Home / Self Care | Source: Ambulatory Visit | Attending: Family Medicine | Admitting: Family Medicine

## 2024-04-10 DIAGNOSIS — M5416 Radiculopathy, lumbar region: Secondary | ICD-10-CM

## 2024-06-08 ENCOUNTER — Other Ambulatory Visit: Payer: Self-pay | Admitting: Podiatry

## 2024-06-08 DIAGNOSIS — M19072 Primary osteoarthritis, left ankle and foot: Secondary | ICD-10-CM

## 2024-06-09 ENCOUNTER — Encounter: Payer: Self-pay | Admitting: Podiatry

## 2024-06-18 ENCOUNTER — Inpatient Hospital Stay: Admission: RE | Admit: 2024-06-18 | Source: Ambulatory Visit

## 2024-06-18 DIAGNOSIS — M19072 Primary osteoarthritis, left ankle and foot: Secondary | ICD-10-CM
